# Patient Record
Sex: Female | Born: 1950
Health system: Southern US, Community
[De-identification: ages and names within clinical notes are randomized; demographics above are authoritative.]

## PROBLEM LIST (undated history)

## (undated) DIAGNOSIS — F32A Depression, unspecified: Secondary | ICD-10-CM

## (undated) DIAGNOSIS — M1990 Unspecified osteoarthritis, unspecified site: Secondary | ICD-10-CM

## (undated) DIAGNOSIS — M810 Age-related osteoporosis without current pathological fracture: Secondary | ICD-10-CM

## (undated) DIAGNOSIS — E079 Disorder of thyroid, unspecified: Secondary | ICD-10-CM

## (undated) DIAGNOSIS — I10 Essential (primary) hypertension: Secondary | ICD-10-CM

## (undated) DIAGNOSIS — H269 Unspecified cataract: Secondary | ICD-10-CM

## (undated) DIAGNOSIS — E785 Hyperlipidemia, unspecified: Secondary | ICD-10-CM

## (undated) DIAGNOSIS — K219 Gastro-esophageal reflux disease without esophagitis: Secondary | ICD-10-CM

## (undated) DIAGNOSIS — F329 Major depressive disorder, single episode, unspecified: Secondary | ICD-10-CM

## (undated) HISTORY — DX: Unspecified osteoarthritis, unspecified site: M19.90

## (undated) HISTORY — DX: Disorder of thyroid, unspecified: E07.9

## (undated) HISTORY — DX: Essential (primary) hypertension: I10

## (undated) HISTORY — DX: Hyperlipidemia, unspecified: E78.5

## (undated) HISTORY — DX: Age-related osteoporosis without current pathological fracture: M81.0

## (undated) HISTORY — DX: Depression, unspecified: F32.A

## (undated) HISTORY — PX: HERNIA REPAIR: SHX51

## (undated) HISTORY — DX: Gastro-esophageal reflux disease without esophagitis: K21.9

## (undated) HISTORY — DX: Unspecified cataract: H26.9

## (undated) HISTORY — DX: Major depressive disorder, single episode, unspecified: F32.9

---
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
## 2004-02-05 ENCOUNTER — Emergency Department (HOSPITAL_COMMUNITY): Admission: EM | Admit: 2004-02-05 | Discharge: 2004-02-05 | Payer: Self-pay | Admitting: *Deleted

## 2004-09-04 ENCOUNTER — Ambulatory Visit: Payer: Self-pay | Admitting: Internal Medicine

## 2004-09-07 ENCOUNTER — Ambulatory Visit: Payer: Self-pay | Admitting: Internal Medicine

## 2004-11-22 ENCOUNTER — Ambulatory Visit: Payer: Self-pay | Admitting: Internal Medicine

## 2004-11-30 ENCOUNTER — Ambulatory Visit: Payer: Self-pay | Admitting: Internal Medicine

## 2005-02-22 ENCOUNTER — Ambulatory Visit: Payer: Self-pay | Admitting: Internal Medicine

## 2005-03-13 ENCOUNTER — Ambulatory Visit: Payer: Self-pay | Admitting: Internal Medicine

## 2005-09-26 ENCOUNTER — Ambulatory Visit: Payer: Self-pay | Admitting: Internal Medicine

## 2005-10-11 ENCOUNTER — Ambulatory Visit: Payer: Self-pay | Admitting: Internal Medicine

## 2006-01-23 ENCOUNTER — Other Ambulatory Visit: Admission: RE | Admit: 2006-01-23 | Discharge: 2006-01-23 | Payer: Self-pay | Admitting: Internal Medicine

## 2006-01-23 ENCOUNTER — Ambulatory Visit: Payer: Self-pay | Admitting: Internal Medicine

## 2006-01-23 ENCOUNTER — Encounter: Payer: Self-pay | Admitting: Internal Medicine

## 2006-06-28 ENCOUNTER — Ambulatory Visit: Payer: Self-pay | Admitting: Internal Medicine

## 2006-07-25 ENCOUNTER — Ambulatory Visit: Payer: Self-pay | Admitting: Internal Medicine

## 2006-10-14 ENCOUNTER — Ambulatory Visit: Payer: Self-pay | Admitting: Internal Medicine

## 2006-10-14 LAB — CONVERTED CEMR LAB
Albumin: 3.8 g/dL (ref 3.5–5.2)
Alkaline Phosphatase: 96 units/L (ref 39–117)
Basophils Absolute: 0 10*3/uL (ref 0.0–0.1)
CO2: 34 meq/L — ABNORMAL HIGH (ref 19–32)
Chol/HDL Ratio, serum: 5
Creatinine, Ser: 0.8 mg/dL (ref 0.4–1.2)
Glomerular Filtration Rate, Af Am: 96 mL/min/{1.73_m2}
Glucose, Bld: 97 mg/dL (ref 70–99)
HDL: 47.1 mg/dL (ref >39.0)
Hemoglobin: 14.3 g/dL (ref 12.0–15.0)
Lymphocytes Relative: 26.9 % (ref 12.0–46.0)
MCHC: 34 g/dL (ref 30.0–36.0)
Monocytes Relative: 8.6 % (ref 3.0–11.0)
Neutro Abs: 3.2 10*3/uL (ref 1.4–7.7)
Neutrophils Relative %: 61.5 % (ref 43.0–77.0)
Platelets: 326 10*3/uL (ref 150–400)
Potassium: 3.9 meq/L (ref 3.5–5.1)
RDW: 12.5 % (ref 11.5–14.6)
TSH: 7.06 microintl units/mL — ABNORMAL HIGH (ref 0.35–5.50)
Total Bilirubin: 0.9 mg/dL (ref 0.3–1.2)
Total Protein: 7.3 g/dL (ref 6.0–8.3)
Triglyceride fasting, serum: 153 mg/dL — ABNORMAL HIGH (ref 0–149)

## 2006-10-21 ENCOUNTER — Encounter (INDEPENDENT_AMBULATORY_CARE_PROVIDER_SITE_OTHER): Payer: Self-pay | Admitting: Specialist

## 2006-10-21 ENCOUNTER — Ambulatory Visit: Payer: Self-pay | Admitting: Internal Medicine

## 2006-10-21 ENCOUNTER — Other Ambulatory Visit: Admission: RE | Admit: 2006-10-21 | Discharge: 2006-10-21 | Payer: Self-pay | Admitting: Neurosurgery

## 2006-11-04 ENCOUNTER — Ambulatory Visit: Payer: Self-pay | Admitting: Internal Medicine

## 2006-11-18 ENCOUNTER — Ambulatory Visit: Payer: Self-pay | Admitting: Gastroenterology

## 2006-12-02 ENCOUNTER — Ambulatory Visit: Payer: Self-pay | Admitting: Internal Medicine

## 2006-12-06 ENCOUNTER — Encounter: Payer: Self-pay | Admitting: Gastroenterology

## 2006-12-06 ENCOUNTER — Ambulatory Visit: Payer: Self-pay | Admitting: Gastroenterology

## 2007-02-04 ENCOUNTER — Ambulatory Visit: Payer: Self-pay | Admitting: Internal Medicine

## 2007-02-04 LAB — CONVERTED CEMR LAB: T4, Total: 6.2 ug/dL (ref 5.0–12.5)

## 2007-04-29 DIAGNOSIS — E785 Hyperlipidemia, unspecified: Secondary | ICD-10-CM

## 2007-04-29 DIAGNOSIS — I10 Essential (primary) hypertension: Secondary | ICD-10-CM

## 2007-05-13 ENCOUNTER — Ambulatory Visit: Payer: Self-pay | Admitting: Internal Medicine

## 2007-05-13 DIAGNOSIS — E039 Hypothyroidism, unspecified: Secondary | ICD-10-CM

## 2007-05-13 DIAGNOSIS — F5102 Adjustment insomnia: Secondary | ICD-10-CM | POA: Insufficient documentation

## 2007-05-13 DIAGNOSIS — F329 Major depressive disorder, single episode, unspecified: Secondary | ICD-10-CM

## 2007-05-26 DIAGNOSIS — L508 Other urticaria: Secondary | ICD-10-CM | POA: Insufficient documentation

## 2007-05-27 ENCOUNTER — Telehealth: Payer: Self-pay | Admitting: *Deleted

## 2007-05-27 ENCOUNTER — Ambulatory Visit: Payer: Self-pay | Admitting: Internal Medicine

## 2007-05-27 LAB — CONVERTED CEMR LAB
CRP, High Sensitivity: 3 (ref 0.00–5.00)
TSH: 3.86 microintl units/mL (ref 0.35–5.50)

## 2007-06-02 ENCOUNTER — Telehealth: Payer: Self-pay | Admitting: Internal Medicine

## 2007-08-11 ENCOUNTER — Ambulatory Visit: Payer: Self-pay | Admitting: Internal Medicine

## 2007-08-11 LAB — CONVERTED CEMR LAB
Cholesterol: 252 mg/dL (ref 0–200)
HDL: 46.8 mg/dL (ref >39.0)
Total CHOL/HDL Ratio: 5.4
Triglycerides: 86 mg/dL (ref 0–149)

## 2007-08-18 ENCOUNTER — Ambulatory Visit: Payer: Self-pay | Admitting: Internal Medicine

## 2007-08-18 LAB — CONVERTED CEMR LAB: HDL goal, serum: 40 mg/dL

## 2007-10-21 ENCOUNTER — Ambulatory Visit: Payer: Self-pay | Admitting: Internal Medicine

## 2007-10-21 LAB — CONVERTED CEMR LAB
Cholesterol: 228 mg/dL (ref 0–200)
HDL: 42 mg/dL (ref >39.0)
Total CHOL/HDL Ratio: 5.4
Triglycerides: 116 mg/dL (ref 0–149)
VLDL: 23 mg/dL (ref 0–40)

## 2007-10-27 ENCOUNTER — Ambulatory Visit: Payer: Self-pay | Admitting: Internal Medicine

## 2007-10-27 LAB — CONVERTED CEMR LAB
T3, Free: 2.8 pg/mL (ref 2.3–4.2)
T4, Total: 7.2 ug/dL (ref 5.0–12.5)
Thyroglobulin Ab: <30 (ref 0.0–60.0)

## 2007-12-24 ENCOUNTER — Ambulatory Visit: Payer: Self-pay | Admitting: Internal Medicine

## 2007-12-24 LAB — CONVERTED CEMR LAB: TSH: 0.04 microintl units/mL — ABNORMAL LOW (ref 0.35–5.50)

## 2008-01-08 ENCOUNTER — Ambulatory Visit: Payer: Self-pay | Admitting: Internal Medicine

## 2008-02-09 ENCOUNTER — Ambulatory Visit: Payer: Self-pay | Admitting: Internal Medicine

## 2008-05-07 ENCOUNTER — Ambulatory Visit: Payer: Self-pay | Admitting: Internal Medicine

## 2008-07-30 ENCOUNTER — Telehealth: Payer: Self-pay | Admitting: Internal Medicine

## 2008-10-15 ENCOUNTER — Ambulatory Visit: Payer: Self-pay | Admitting: Internal Medicine

## 2008-10-15 LAB — CONVERTED CEMR LAB
ALT: 28 units/L (ref 0–35)
AST: 23 units/L (ref 0–37)
CO2: 33 meq/L — ABNORMAL HIGH (ref 19–32)
Chloride: 108 meq/L (ref 96–112)
Cholesterol: 191 mg/dL (ref 0–200)
Creatinine, Ser: 0.7 mg/dL (ref 0.4–1.2)
Glucose, Bld: 92 mg/dL (ref 70–99)
HDL: 41 mg/dL (ref >39.0)
TSH: 0.04 microintl units/mL — ABNORMAL LOW (ref 0.35–5.50)
Total Bilirubin: 0.9 mg/dL (ref 0.3–1.2)
Total CHOL/HDL Ratio: 4.7
Total Protein: 7.1 g/dL (ref 6.0–8.3)
Triglycerides: 163 mg/dL — ABNORMAL HIGH (ref 0–149)

## 2008-10-22 ENCOUNTER — Ambulatory Visit: Payer: Self-pay | Admitting: Internal Medicine

## 2008-11-15 ENCOUNTER — Ambulatory Visit: Payer: Self-pay | Admitting: Internal Medicine

## 2008-11-15 ENCOUNTER — Encounter: Payer: Self-pay | Admitting: Internal Medicine

## 2009-02-22 ENCOUNTER — Ambulatory Visit: Payer: Self-pay | Admitting: Internal Medicine

## 2009-02-22 DIAGNOSIS — M810 Age-related osteoporosis without current pathological fracture: Secondary | ICD-10-CM | POA: Insufficient documentation

## 2009-03-31 ENCOUNTER — Telehealth: Payer: Self-pay | Admitting: Internal Medicine

## 2009-06-23 ENCOUNTER — Ambulatory Visit: Payer: Self-pay | Admitting: Internal Medicine

## 2009-06-23 LAB — CONVERTED CEMR LAB
CO2: 34 meq/L — ABNORMAL HIGH (ref 19–32)
Calcium: 9.9 mg/dL (ref 8.4–10.5)
Creatinine, Ser: 0.7 mg/dL (ref 0.4–1.2)
Direct LDL: 114.8 mg/dL
GFR calc non Af Amer: 91.49 mL/min (ref >60)
Glucose, Bld: 104 mg/dL — ABNORMAL HIGH (ref 70–99)
T3, Free: 3.7 pg/mL (ref 2.3–4.2)

## 2009-10-08 HISTORY — PX: UMBILICAL HERNIA REPAIR: SHX196

## 2009-10-20 ENCOUNTER — Ambulatory Visit: Payer: Self-pay | Admitting: Internal Medicine

## 2010-06-26 ENCOUNTER — Ambulatory Visit: Payer: Self-pay | Admitting: Internal Medicine

## 2010-06-26 LAB — CONVERTED CEMR LAB
Albumin: 3.7 g/dL (ref 3.5–5.2)
Alkaline Phosphatase: 71 units/L (ref 39–117)
BUN: 13 mg/dL (ref 6–23)
Basophils Absolute: 0 10*3/uL (ref 0.0–0.1)
CO2: 32 meq/L (ref 19–32)
Calcium: 9.2 mg/dL (ref 8.4–10.5)
Creatinine, Ser: 0.7 mg/dL (ref 0.4–1.2)
Eosinophils Absolute: 0.2 10*3/uL (ref 0.0–0.7)
Glucose, Bld: 96 mg/dL (ref 70–99)
Glucose, Urine, Semiquant: NEGATIVE
HDL: 47.8 mg/dL (ref >39.00)
Hemoglobin: 13.9 g/dL (ref 12.0–15.0)
Ketones, urine, test strip: NEGATIVE
Lymphocytes Relative: 24.8 % (ref 12.0–46.0)
MCHC: 34.2 g/dL (ref 30.0–36.0)
Neutro Abs: 3 10*3/uL (ref 1.4–7.7)
Neutrophils Relative %: 59.7 % (ref 43.0–77.0)
RDW: 13.9 % (ref 11.5–14.6)
Specific Gravity, Urine: 1.02
Triglycerides: 94 mg/dL (ref 0.0–149.0)
pH: 8.5

## 2010-07-03 ENCOUNTER — Other Ambulatory Visit: Admission: RE | Admit: 2010-07-03 | Discharge: 2010-07-03 | Payer: Self-pay | Admitting: Internal Medicine

## 2010-07-03 ENCOUNTER — Encounter: Payer: Self-pay | Admitting: Internal Medicine

## 2010-07-03 ENCOUNTER — Ambulatory Visit: Payer: Self-pay | Admitting: Internal Medicine

## 2010-11-07 NOTE — Assessment & Plan Note (Signed)
Summary: cpx /njr/pt rsc/cjr   Vital Signs:  Patient profile:   60 year old female Height:      67 inches Weight:      244 pounds BMI:     38.35 Temp:     98.2 degrees F oral Pulse rate:   76 / minute Resp:     14 per minute BP sitting:   136 / 80  (left arm) Cuff size:   large  Vitals Entered By: Willy Eddy, LPN (July 03, 2010 3:24 PM)  Nutrition Counseling: Patient's BMI is greater than 25 and therefore counseled on weight management options. CC: cpx, Hypertension Management Is Patient Diabetic? No   Primary Care Provider:  Stacie Glaze MD  CC:  cpx and Hypertension Management.  History of Present Illness: The pt was asked about all immunizations, health maint. services that are appropriate to their age and was given guidance on diet exercize  and weight management  weight is stll the main issue with stress eating discussion of replacement for eating  Hypertension History:      She denies headache, chest pain, palpitations, dyspnea with exertion, orthopnea, PND, peripheral edema, visual symptoms, neurologic problems, syncope, and side effects from treatment.        Positive major cardiovascular risk factors include female age 31 years old or older, hyperlipidemia, and hypertension.  Negative major cardiovascular risk factors include no history of diabetes and non-tobacco-user status.        Further assessment for target organ damage reveals no history of ASHD, stroke/TIA, or peripheral vascular disease.     Preventive Screening-Counseling & Management  Alcohol-Tobacco     Smoking Status: never     Tobacco Counseling: not indicated; no tobacco use  Problems Prior to Update: 1)  Osteoporosis, Lumbar Spine  (ICD-733.00) 2)  Urticaria Nec  (ICD-708.8) 3)  Hypothyroidism  (ICD-244.9) 4)  Depression  (ICD-311) 5)  Insomnia, Transient  (ICD-307.41) 6)  Hypertension  (ICD-401.9) 7)  Hyperlipidemia  (ICD-272.4)  Current Problems (verified): 1)   Osteoporosis, Lumbar Spine  (ICD-733.00) 2)  Urticaria Nec  (ICD-708.8) 3)  Hypothyroidism  (ICD-244.9) 4)  Depression  (ICD-311) 5)  Insomnia, Transient  (ICD-307.41) 6)  Hypertension  (ICD-401.9) 7)  Hyperlipidemia  (ICD-272.4)  Medications Prior to Update: 1)  Benicar Hct 40-25 Mg  Tabs (Olmesartan Medoxomil-Hctz) .... One By Mouth Daily 2)  Eye Vitamins   Tabs (Multiple Vitamins-Minerals) .... Take One By Mouth Daily 3)  Multivitamins   Tabs (Multiple Vitamin) .... Take One Tablet By Mouth Daily 4)  Cymbalta 30 Mg  Cpep (Duloxetine Hcl) .... Take One Po Daily 5)  Os-Cal Ultra 600 Mg  Tabs (Calcium Carb-Vit D-C-E-Mineral) .... Take One By Mouth Two Times A Day 6)  Synthroid 200 Mcg  Tabs (Levothyroxine Sodium) .... One By Mouth Daily 7)  Red Yeast Rice 600 Mg Tabs (Red Yeast Rice Extract) .... Once Daily 8)  Actonel 150 Mg Tabs (Risedronate Sodium) .... One By Mouth Mnthly 9)  Zovirax 5 % Oint (Acyclovir) .... Apply To Site 5 X A Day As Needed 10)  Betamethasone Dipropionate Aug 0.05 % Crea (Betamethasone Dipropionate Aug) .... Aplly To Site Two Times A Day  Current Medications (verified): 1)  Benicar Hct 40-25 Mg  Tabs (Olmesartan Medoxomil-Hctz) .... One By Mouth Daily 2)  Eye Vitamins   Tabs (Multiple Vitamins-Minerals) .... Take One By Mouth Daily 3)  Multivitamins   Tabs (Multiple Vitamin) .... Take One Tablet By Mouth Daily 4)  Cymbalta  30 Mg  Cpep (Duloxetine Hcl) .... Take One Po Daily 5)  Os-Cal Ultra 600 Mg  Tabs (Calcium Carb-Vit D-C-E-Mineral) .... Take One By Mouth Two Times A Day 6)  Synthroid 200 Mcg  Tabs (Levothyroxine Sodium) .... One By Mouth Daily 7)  Red Yeast Rice 600 Mg Tabs (Red Yeast Rice Extract) .... Once Daily 8)  Actonel 150 Mg Tabs (Risedronate Sodium) .... One By Mouth Mnthly 9)  Zovirax 5 % Oint (Acyclovir) .... Apply To Site 5 X A Day As Needed 10)  Betamethasone Dipropionate Aug 0.05 % Crea (Betamethasone Dipropionate Aug) .... Aplly To Site Two  Times A Day  Allergies (verified): 1)  ! Pcn 2)  ! Erythromycin  Past History:  Family History: Last updated: 04/29/2007 Family History of Arthritis Family History Other cancer Family History of Cardiovascular disorder  Social History: Last updated: 04/29/2007 Married Never Smoked Alcohol use-no Drug use-no Regular exercise-no  Risk Factors: Exercise: no (04/29/2007)  Risk Factors: Smoking Status: never (07/03/2010)  Past medical, surgical, family and social histories (including risk factors) reviewed, and no changes noted (except as noted below).  Past Medical History: Reviewed history from 05/13/2007 and no changes required. Hyperlipidemia Hypertension Depression Hypothyroidism  Past Surgical History: Reviewed history from 04/29/2007 and no changes required. Appendectomy Colonoscopy-12/06/2006  Family History: Reviewed history from 04/29/2007 and no changes required. Family History of Arthritis Family History Other cancer Family History of Cardiovascular disorder  Social History: Reviewed history from 04/29/2007 and no changes required. Married Never Smoked Alcohol use-no Drug use-no Regular exercise-no  Review of Systems  The patient denies anorexia, fever, weight loss, weight gain, vision loss, decreased hearing, hoarseness, chest pain, syncope, dyspnea on exertion, peripheral edema, prolonged cough, headaches, hemoptysis, abdominal pain, melena, hematochezia, severe indigestion/heartburn, hematuria, incontinence, genital sores, muscle weakness, suspicious skin lesions, transient blindness, difficulty walking, depression, unusual weight change, abnormal bleeding, enlarged lymph nodes, angioedema, breast masses, and testicular masses.    Physical Exam  General:  well-hydrated and overweight-appearing.   Head:  normocephalic and atraumatic.   Eyes:  pupils equal and pupils round.   Ears:  R ear normal and L ear normal.   Nose:  no external deformity  and no nasal discharge.   Mouth:  good dentition and pharynx pink and moist.   Neck:  No deformities, masses, or tenderness noted. Lungs:  normal respiratory effort and no crackles.   Heart:  normal rate and regular rhythm.   Abdomen:  Bowel sounds positive,abdomen soft and non-tender without masses, organomegaly or hernias noted. Rectal:  no external abnormalities and no masses.   Genitalia:  tag vs wart or labianormal introitus, no vaginal atrophy, and no friaility or hemorrhage.   Msk:  No deformity or scoliosis noted of thoracic or lumbar spine.   Pulses:  R and L carotid,radial,femoral,dorsalis pedis and posterior tibial pulses are full and equal bilaterally Extremities:  trace left pedal edema and trace right pedal edema.   Neurologic:  alert & oriented X3 and finger-to-nose normal.     Impression & Recommendations:  Problem # 1:  PREVENTIVE HEALTH CARE (ICD-V70.0) The pt was asked about all immunizations, health maint. services that are appropriate to their age and was given guidance on diet exercize  and weight management  Td Booster: Tdap (07/03/2010)   Flu Vax: Fluvax Non-MCR (08/18/2007)   Chol: 194 (06/26/2010)   HDL: 47.80 (06/26/2010)   LDL: 127 (06/26/2010)   TG: 94.0 (06/26/2010) TSH: 0.05 (06/26/2010)    Discussed using sunscreen, use of  alcohol, drug use, self breast exam, routine dental care, routine eye care, schedule for GYN exam, routine physical exam, seat belts, multiple vitamins, osteoporosis prevention, adequate calcium intake in diet, recommendations for immunizations, mammograms and Pap smears.  Discussed exercise and checking cholesterol.  Discussed gun safety, safe sex, and contraception.  Problem # 2:  HYPERTENSION (ICD-401.9)  Her updated medication list for this problem includes:    Benicar Hct 40-25 Mg Tabs (Olmesartan medoxomil-hctz) ..... One by mouth daily  BP today: 136/80 Prior BP: 140/90 (10/20/2009)  10 Yr Risk Heart Disease: 9 % Prior 10 Yr  Risk Heart Disease: 15 % (10/20/2009)  Labs Reviewed: K+: 4.4 (06/26/2010) Creat: : 0.7 (06/26/2010)   Chol: 194 (06/26/2010)   HDL: 47.80 (06/26/2010)   LDL: 127 (06/26/2010)   TG: 94.0 (06/26/2010)  Problem # 3:  MORBID OBESITY (ICD-278.01)  weight loss  Ht: 67 (07/03/2010)   Wt: 244 (07/03/2010)   BMI: 38.35 (07/03/2010)  Complete Medication List: 1)  Benicar Hct 40-25 Mg Tabs (Olmesartan medoxomil-hctz) .... One by mouth daily 2)  Eye Vitamins Tabs (Multiple vitamins-minerals) .... Take one by mouth daily 3)  Multivitamins Tabs (Multiple vitamin) .... Take one tablet by mouth daily 4)  Cymbalta 30 Mg Cpep (Duloxetine hcl) .... Take one po daily 5)  Os-cal Ultra 600 Mg Tabs (Calcium carb-vit d-c-e-mineral) .... Take one by mouth two times a day 6)  Synthroid 200 Mcg Tabs (Levothyroxine sodium) .... One by mouth daily 7)  Red Yeast Rice 600 Mg Tabs (Red yeast rice extract) .... Once daily 8)  Actonel 150 Mg Tabs (Risedronate sodium) .... One by mouth mnthly 9)  Zovirax 5 % Oint (Acyclovir) .... Apply to site 5 x a day as needed 10)  Betamethasone Dipropionate Aug 0.05 % Crea (Betamethasone dipropionate aug) .... Aplly to site two times a day  Other Orders: Tdap => 46yrs IM ([address]109) Admin 1st Vaccine (60454)  Hypertension Assessment/Plan:      The patient's hypertensive risk group is category B: At least one risk factor (excluding diabetes) with no target organ damage.  Her calculated 10 year risk of coronary heart disease is 9 %.  Today's blood pressure is 136/80.  Her blood pressure goal is < 140/90.  Patient Instructions: 1)  Please schedule a follow-up appointment in 6 months. 2)  It is important that you exercise regularly at least 20 minutes 5 times a week. If you develop chest pain, have severe difficulty breathing, or feel very tired , stop exercising immediately and seek medical attention. 3)  You need to lose weight. Consider a lower calorie diet and regular exercise.     Immunizations Administered:  Tetanus Vaccine:    Vaccine Type: Tdap    Site: right deltoid    Mfr: GlaxoSmithKline    Dose: 0.5 ml    Route: IM    Given by: Willy Eddy, LPN    Exp. Date: 07/27/2012    Lot #: [id number]    VIS given: 08/25/08 version given July 03, 2010.

## 2010-11-07 NOTE — Assessment & Plan Note (Signed)
Summary: 4 month rov/njr rsc bmp/njr   Vital Signs:  Patient profile:   60 year old female Height:      67 inches Weight:      242 pounds BMI:     38.04 Temp:     98.2 degrees F oral Pulse rate:   76 / minute Resp:     14 per minute BP sitting:   140 / 90  (left arm) Cuff size:   large  Vitals Entered By: Willy Eddy, LPN (October 20, 2009 9:01 AM) CC: roa, Hypertension Management, Lipid Management   CC:  roa, Hypertension Management, and Lipid Management.  History of Present Illness: review of the labs with notation that she tool the synthroid on the AM of the lab report she feels well the lipids were reviewed and diet ands exercize goals set etna mandates change to [facility]  Hypertension History:      She denies headache, chest pain, palpitations, dyspnea with exertion, orthopnea, PND, peripheral edema, visual symptoms, neurologic problems, syncope, and side effects from treatment.        Positive major cardiovascular risk factors include female age 1 years old or older, hyperlipidemia, and hypertension.  Negative major cardiovascular risk factors include no history of diabetes and non-tobacco-user status.        Further assessment for target organ damage reveals no history of ASHD, stroke/TIA, or peripheral vascular disease.    Lipid Management History:      Positive NCEP/ATP III risk factors include female age 60 years old or older, HDL cholesterol less than 40, and hypertension.  Negative NCEP/ATP III risk factors include no history of early menopause without estrogen hormone replacement, non-diabetic, non-tobacco-user status, no ASHD (atherosclerotic heart disease), no prior stroke/TIA, no peripheral vascular disease, and no history of aortic aneurysm.      Preventive Screening-Counseling & Management  Alcohol-Tobacco     Smoking Status: never  Problems Prior to Update: 1)  Osteoporosis, Lumbar Spine  (ICD-733.00) 2)  Urticaria Nec  (ICD-708.8) 3)  Hypothyroidism   (ICD-244.9) 4)  Depression  (ICD-311) 5)  Insomnia, Transient  (ICD-307.41) 6)  Hypertension  (ICD-401.9) 7)  Hyperlipidemia  (ICD-272.4)  Medications Prior to Update: 1)  Benicar Hct 40-25 Mg  Tabs (Olmesartan Medoxomil-Hctz) .... One By Mouth Daily 2)  Eye Vitamins   Tabs (Multiple Vitamins-Minerals) .... Take One By Mouth Daily 3)  Multivitamins   Tabs (Multiple Vitamin) .... Take One Tablet By Mouth Daily 4)  Cymbalta 30 Mg  Cpep (Duloxetine Hcl) .... Take One Po Daily 5)  Os-Cal Ultra 600 Mg  Tabs (Calcium Carb-Vit D-C-E-Mineral) .... Take One By Mouth Two Times A Day 6)  Synthroid 200 Mcg  Tabs (Levothyroxine Sodium) .... One By Mouth Daily 7)  Red Yeast Rice 600 Mg Tabs (Red Yeast Rice Extract) .... Once Daily 8)  Actonel 150 Mg Tabs (Risedronate Sodium) .... One By Mouth Mnthly 9)  Zovirax 5 % Oint (Acyclovir) .... Apply To Site 5 X A Day As Needed  Current Medications (verified): 1)  Benicar Hct 40-25 Mg  Tabs (Olmesartan Medoxomil-Hctz) .... One By Mouth Daily 2)  Eye Vitamins   Tabs (Multiple Vitamins-Minerals) .... Take One By Mouth Daily 3)  Multivitamins   Tabs (Multiple Vitamin) .... Take One Tablet By Mouth Daily 4)  Cymbalta 30 Mg  Cpep (Duloxetine Hcl) .... Take One Po Daily 5)  Os-Cal Ultra 600 Mg  Tabs (Calcium Carb-Vit D-C-E-Mineral) .... Take One By Mouth Two Times A Day 6)  Synthroid 200 Mcg  Tabs (Levothyroxine Sodium) .... One By Mouth Daily 7)  Red Yeast Rice 600 Mg Tabs (Red Yeast Rice Extract) .... Once Daily 8)  Actonel 150 Mg Tabs (Risedronate Sodium) .... One By Mouth Mnthly 9)  Zovirax 5 % Oint (Acyclovir) .... Apply To Site 5 X A Day As Needed  Allergies (verified): 1)  ! Pcn 2)  ! Erythromycin  Past History:  Family History: Last updated: 04/29/2007 Family History of Arthritis Family History Other cancer Family History of Cardiovascular disorder  Social History: Last updated: 04/29/2007 Married Never Smoked Alcohol use-no Drug  use-no Regular exercise-no  Risk Factors: Exercise: no (04/29/2007)  Risk Factors: Smoking Status: never (10/20/2009)  Past medical, surgical, family and social histories (including risk factors) reviewed, and no changes noted (except as noted below).  Past Medical History: Reviewed history from 05/13/2007 and no changes required. Hyperlipidemia Hypertension Depression Hypothyroidism  Past Surgical History: Reviewed history from 04/29/2007 and no changes required. Appendectomy Colonoscopy-12/06/2006  Family History: Reviewed history from 04/29/2007 and no changes required. Family History of Arthritis Family History Other cancer Family History of Cardiovascular disorder  Social History: Reviewed history from 04/29/2007 and no changes required. Married Never Smoked Alcohol use-no Drug use-no Regular exercise-no  Review of Systems  The patient denies anorexia, fever, weight loss, weight gain, vision loss, decreased hearing, hoarseness, chest pain, syncope, dyspnea on exertion, peripheral edema, prolonged cough, headaches, hemoptysis, abdominal pain, melena, hematochezia, severe indigestion/heartburn, hematuria, incontinence, genital sores, muscle weakness, suspicious skin lesions, transient blindness, difficulty walking, depression, unusual weight change, abnormal bleeding, enlarged lymph nodes, angioedema, and breast masses.    Physical Exam  General:  well-hydrated and overweight-appearing.   Head:  normocephalic and atraumatic.   Eyes:  pupils equal and pupils round.   Nose:  no external deformity and no nasal discharge.   Mouth:  good dentition and pharynx pink and moist.   Neck:  No deformities, masses, or tenderness noted. Lungs:  normal respiratory effort and no crackles.   Heart:  normal rate and regular rhythm.   Abdomen:  Bowel sounds positive,abdomen soft and non-tender without masses, organomegaly or hernias noted. Msk:  normal ROM, no joint tenderness, and  no joint swelling.   Extremities:  No clubbing, cyanosis, edema, or deformity noted with normal full range of motion of all joints.   Neurologic:  No cranial nerve deficits noted. Station and gait are normal. Plantar reflexes are down-going bilaterally. DTRs are symmetrical throughout. Sensory, motor and coordinative functions appear intact.   Impression & Recommendations:  Problem # 1:  HYPOTHYROIDISM (ICD-244.9)  Her updated medication list for this problem includes:    Synthroid 200 Mcg Tabs (Levothyroxine sodium) ..... One by mouth daily  Labs Reviewed: TSH: 0.03 (06/23/2009)   Free T4: 1.9 (06/23/2009)    Chol: 179 (06/23/2009)   HDL: 38.10 (06/23/2009)   LDL: 117 (10/15/2008)   TG: 163 (10/15/2008)  Problem # 2:  HYPERTENSION (ICD-401.9) weigth gain Her updated medication list for this problem includes:    Benicar Hct 40-25 Mg Tabs (Olmesartan medoxomil-hctz) ..... One by mouth daily  BP today: 140/90 Prior BP: 120/80 (06/23/2009)  10 Yr Risk Heart Disease: 15 % Prior 10 Yr Risk Heart Disease: 11 % (02/22/2009)  Labs Reviewed: K+: 3.9 (06/23/2009) Creat: : 0.7 (06/23/2009)   Chol: 179 (06/23/2009)   HDL: 38.10 (06/23/2009)   LDL: 117 (10/15/2008)   TG: 163 (10/15/2008)  Problem # 3:  HYPERLIPIDEMIA (ICD-272.4) using beyond red rick yeast Labs Reviewed: SGOT:  23 (10/15/2008)   SGPT: 28 (10/15/2008)  Lipid Goals: Chol Goal: 200 (08/18/2007)   HDL Goal: 40 (08/18/2007)   LDL Goal: 130 (08/18/2007)   TG Goal: 150 (08/18/2007)  10 Yr Risk Heart Disease: 15 % Prior 10 Yr Risk Heart Disease: 11 % (02/22/2009)   HDL:38.10 (06/23/2009), 41.0 (10/15/2008)  LDL:117 (10/15/2008), DEL (10/21/2007)  Chol:179 (06/23/2009), 191 (10/15/2008)  Trig:163 (10/15/2008), 116 (10/21/2007)  Complete Medication List: 1)  Benicar Hct 40-25 Mg Tabs (Olmesartan medoxomil-hctz) .... One by mouth daily 2)  Eye Vitamins Tabs (Multiple vitamins-minerals) .... Take one by mouth daily 3)   Multivitamins Tabs (Multiple vitamin) .... Take one tablet by mouth daily 4)  Cymbalta 30 Mg Cpep (Duloxetine hcl) .... Take one po daily 5)  Os-cal Ultra 600 Mg Tabs (Calcium carb-vit d-c-e-mineral) .... Take one by mouth two times a day 6)  Synthroid 200 Mcg Tabs (Levothyroxine sodium) .... One by mouth daily 7)  Red Yeast Rice 600 Mg Tabs (Red yeast rice extract) .... Once daily 8)  Actonel 150 Mg Tabs (Risedronate sodium) .... One by mouth mnthly 9)  Zovirax 5 % Oint (Acyclovir) .... Apply to site 5 x a day as needed 10)  Betamethasone Dipropionate Aug 0.05 % Crea (Betamethasone dipropionate aug) .... Aplly to site two times a day  Hypertension Assessment/Plan:      The patient's hypertensive risk group is category B: At least one risk factor (excluding diabetes) with no target organ damage.  Her calculated 10 year risk of coronary heart disease is 15 %.  Today's blood pressure is 140/90.  Her blood pressure goal is < 140/90.  Lipid Assessment/Plan:      Based on NCEP/ATP III, the patient's risk factor category is "2 or more risk factors and a calculated 10 year CAD risk of < 20%".  The patient's lipid goals are as follows: Total cholesterol goal is 200; LDL cholesterol goal is 130; HDL cholesterol goal is 40; Triglyceride goal is 150.  Her LDL cholesterol goal has been met.  Secondary causes for hyperlipidemia have been ruled out.  She has been counseled on adjunctive measures for lowering her cholesterol and has been provided with dietary instructions.    Patient Instructions: 1)  Please schedule a follow-up appointment in 6 months. Prescriptions: BETAMETHASONE DIPROPIONATE AUG 0.05 % CREA (BETAMETHASONE DIPROPIONATE AUG) aplly to site two times a day  #30gm x 1   Entered and Authorized by:   Stacie Glaze MD   Signed by:   Stacie Glaze MD on 10/20/2009   Method used:   Electronically to        [facility] (retail)       [address]       [place], [place]  [address]       Ph: [phone]       Fax: [phone]   RxID:   [id number] ACTONEL 150 MG TABS (RISEDRONATE SODIUM) one by mouth mnthly  #5 x 3   Entered and Authorized by:   Stacie Glaze MD   Signed by:   Stacie Glaze MD on 10/20/2009   Method used:   Electronically to        [facility]* (mail-order)       [address] [place], [place]  [address]       Ph: [phone]       Fax: [phone]  RxID:   [id number] CYMBALTA 30 MG  CPEP (DULOXETINE HCL) Take one po daily  #90 x 3   Entered and Authorized by:   Stacie Glaze MD   Signed by:   Stacie Glaze MD on 10/20/2009   Method used:   Electronically to        [facility]* (mail-order)       [address] [place], [place]  [address]       Ph: [phone]       Fax: [phone]   RxID:   [id number] BENICAR HCT 40-25 MG  TABS (OLMESARTAN MEDOXOMIL-HCTZ) one by mouth daily  #90 x 3   Entered and Authorized by:   Stacie Glaze MD   Signed by:   Stacie Glaze MD on 10/20/2009   Method used:   Electronically to        [facility]* (mail-order)       [address] [place], [place]  [address]       Ph: [phone]       Fax: [phone]   RxID:   [id number]

## 2010-12-21 ENCOUNTER — Encounter: Payer: Self-pay | Admitting: Internal Medicine

## 2010-12-25 ENCOUNTER — Ambulatory Visit (INDEPENDENT_AMBULATORY_CARE_PROVIDER_SITE_OTHER): Payer: Managed Care, Other (non HMO) | Admitting: Internal Medicine

## 2010-12-25 ENCOUNTER — Encounter: Payer: Self-pay | Admitting: Internal Medicine

## 2010-12-25 ENCOUNTER — Ambulatory Visit: Payer: Self-pay | Admitting: Internal Medicine

## 2010-12-25 VITALS — BP 120/82 | HR 72 | Temp 98.2°F | Resp 14 | Ht 67.0 in | Wt 250.0 lb

## 2010-12-25 DIAGNOSIS — I10 Essential (primary) hypertension: Secondary | ICD-10-CM

## 2010-12-25 DIAGNOSIS — E785 Hyperlipidemia, unspecified: Secondary | ICD-10-CM

## 2010-12-25 DIAGNOSIS — E039 Hypothyroidism, unspecified: Secondary | ICD-10-CM

## 2010-12-25 NOTE — Assessment & Plan Note (Signed)
At goal but weight gain is an issue that needs to be addressed

## 2010-12-25 NOTE — Assessment & Plan Note (Signed)
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 We will monitor in 3 months time to see if she's been successful in turning around weight gain and beginning an exercise program which are very important for her continued health

## 2010-12-25 NOTE — Assessment & Plan Note (Signed)
Stable t3 and t4

## 2010-12-25 NOTE — Progress Notes (Signed)
Subjective:    Patient ID: [name], female    DOB: 06-Nov-1950, 60 y.o.   MRN: [id number]  HPI   patient is a 60 year old white female who presents for followup of hyperlipidemia hypertension hypothyroidism and morbid obesity.  His weight checked today to see if she was able to follow a diet plan and lose weight she has not exercised she has not followed the diet plan and she has actually gained weight since her last visit.  We discussed the impact on her other comorbid problems of weight gain her blood pressure remained stable we'll not she continues to gain weight her lipids were at goal last visit but they will not be if she continues to gain weight plus osteoarthritic problems and will markedly increase if she does not lose weight.    Review of Systems  Constitutional: Negative for activity change, appetite change and fatigue.  HENT: Negative for ear pain, congestion, neck pain, postnasal drip and sinus pressure.   Eyes: Negative for redness and visual disturbance.  Respiratory: Negative for cough, shortness of breath and wheezing.   Gastrointestinal: Negative for abdominal pain and abdominal distention.  Genitourinary: Negative for dysuria, frequency and menstrual problem.  Musculoskeletal: Negative for myalgias, joint swelling and arthralgias.  Skin: Negative for rash and wound.  Neurological: Negative for dizziness, weakness and headaches.  Hematological: Negative for adenopathy. Does not bruise/bleed easily.  Psychiatric/Behavioral: Negative for sleep disturbance and decreased concentration.   Past Medical History  Diagnosis Date  . Hyperlipidemia   . Hypertension   . Depression   . Thyroid disease    Past Surgical History  Procedure Date  . Appendectomy   . Colonoscopy     reports that she has never smoked. She does not have any smokeless tobacco history on file. She reports that she does not drink alcohol or use illicit drugs. family history includes Cancer in  her father; Heart disease in her mother; and Hyperlipidemia in her mother. Allergies  Allergen Reactions  . Erythromycin     REACTION: Rash  . Penicillins     REACTION: Hives       Objective:   Physical Exam  Nursing note and vitals reviewed. Constitutional: She is oriented to person, place, and time. She appears well-developed and well-nourished. No distress.        Morbidly obese  HENT:  Head: Normocephalic and atraumatic.  Right Ear: External ear normal.  Left Ear: External ear normal.  Nose: Nose normal.  Mouth/Throat: Oropharynx is clear and moist.  Eyes: Conjunctivae and EOM are normal. Pupils are equal, round, and reactive to light.  Neck: Normal range of motion. Neck supple. No JVD present. No tracheal deviation present. No thyromegaly present.  Cardiovascular: Normal rate, regular rhythm, normal heart sounds and intact distal pulses.   No murmur heard. Pulmonary/Chest: Effort normal and breath sounds normal. She has no wheezes. She exhibits no tenderness.  Abdominal: Soft. Bowel sounds are normal.  Musculoskeletal: Normal range of motion. She exhibits no edema and no tenderness.  Lymphadenopathy:    She has no cervical adenopathy.  Neurological: She is alert and oriented to person, place, and time. She has normal reflexes. No cranial nerve deficit.  Skin: Skin is warm and dry. She is not diaphoretic.  Psychiatric: She has a normal mood and affect. Her behavior is normal.          Assessment & Plan:   we spent over 30 minutes face-to-face counseling the patient about diet exercise and weight  loss of their impact on her other problems she is currently stable with hypertension hyperlipidemia hypothyroidism but these would not remain stable she continues on his  Weight gain followup visit  at 4 months

## 2010-12-25 NOTE — Assessment & Plan Note (Signed)
Blood pressure is stable 

## 2011-01-30 ENCOUNTER — Other Ambulatory Visit: Payer: Self-pay | Admitting: *Deleted

## 2011-01-30 MED ORDER — CEPHALEXIN 500 MG PO CAPS: ORAL_CAPSULE | ORAL | Status: AC

## 2011-01-30 NOTE — Telephone Encounter (Signed)
Dr. Lovell Sheehan notified re: Penicillin allergy and Keflex is fine.

## 2011-01-30 NOTE — Telephone Encounter (Signed)
Per dr Lovell Sheehan- may have keflex 500 tid for 7 days and she needs to suck on lemon to stimulate the salivary gland- if not better will need to go to ent

## 2011-01-30 NOTE — Telephone Encounter (Signed)
Chart states pt has penicillin allergy.

## 2011-01-30 NOTE — Telephone Encounter (Signed)
Pt has a ? Blocked salivary gland on one side of face.  Looks like mumps, pt states.  No pain.  Looking for advice.

## 2011-02-25 ENCOUNTER — Inpatient Hospital Stay (HOSPITAL_COMMUNITY)
Admission: EM | Admit: 2011-02-25 | Discharge: 2011-02-27 | DRG: 355 | Disposition: A | Discharge status: Home / Self Care | Payer: Managed Care, Other (non HMO) | Attending: General Surgery | Admitting: General Surgery

## 2011-02-25 DIAGNOSIS — Z888 Allergy status to other drugs, medicaments and biological substances status: Secondary | ICD-10-CM

## 2011-02-25 DIAGNOSIS — I10 Essential (primary) hypertension: Secondary | ICD-10-CM | POA: Diagnosis present

## 2011-02-25 DIAGNOSIS — Z880 Allergy status to penicillin: Secondary | ICD-10-CM

## 2011-02-25 DIAGNOSIS — K420 Umbilical hernia with obstruction, without gangrene: Principal | ICD-10-CM | POA: Diagnosis present

## 2011-02-25 DIAGNOSIS — F3289 Other specified depressive episodes: Secondary | ICD-10-CM | POA: Diagnosis present

## 2011-02-25 DIAGNOSIS — E039 Hypothyroidism, unspecified: Secondary | ICD-10-CM | POA: Diagnosis present

## 2011-02-25 DIAGNOSIS — F329 Major depressive disorder, single episode, unspecified: Secondary | ICD-10-CM | POA: Diagnosis present

## 2011-02-25 LAB — BASIC METABOLIC PANEL
CO2: 34 mEq/L — ABNORMAL HIGH (ref 19–32)
Glucose, Bld: 108 mg/dL — ABNORMAL HIGH (ref 70–99)
Potassium: 3.6 mEq/L (ref 3.5–5.1)
Sodium: 144 mEq/L (ref 135–145)

## 2011-02-25 LAB — DIFFERENTIAL
Basophils Absolute: 0 10*3/uL (ref 0.0–0.1)
Lymphocytes Relative: 21 % (ref 12–46)
Lymphs Abs: 1.3 10*3/uL (ref 0.7–4.0)
Monocytes Absolute: 0.7 10*3/uL (ref 0.1–1.0)
Neutro Abs: 4.3 10*3/uL (ref 1.7–7.7)

## 2011-02-25 LAB — CBC
HCT: 42.3 % (ref 36.0–46.0)
Hemoglobin: 14.4 g/dL (ref 12.0–15.0)
WBC: 6.5 10*3/uL (ref 4.0–10.5)

## 2011-02-26 NOTE — H&P (Signed)
  NAMEANGELINA, [name]                ACCOUNT NO.:  [id number]  MEDICAL RECORD NO.:  [id number]           PATIENT TYPE:  I  LOCATION:  5505                         FACILITY:  [facility]  PHYSICIAN:  Ollen Gross. Vernell Morgans, M.D. DATE OF BIRTH:  1951/08/26  DATE OF ADMISSION:  02/25/2011 DATE OF DISCHARGE:                             HISTORY & PHYSICAL   HISTORY OF PRESENT ILLNESS:  [name] is a 60 year old white female who knows she has had an umbilical hernia for a long time.  On Saturday morning, she noticed that it got bigger and harder and would not go down even when lying down.  She has had some discomfort associated with it. She has not had any nausea or vomiting.  She has no fevers or chills. Her bowels are moving regularly.  PAST MEDICAL HISTORY:  Significant for: 1. Hypothyroidism. 2. Hypertension. 3. Depression. 4. Umbilical hernia.  PAST SURGICAL HISTORY:  Significant for appendectomy.  MEDICATIONS:  Benicar, Cymbalta, and Synthroid.  ALLERGIES:  PENICILLIN and ERYTHROMYCIN which causes rash.  SOCIAL HISTORY:  She denies use of tobacco or tobacco products.  FAMILY HISTORY:  Noncontributory.  PHYSICAL EXAMINATION:  VITAL SIGNS:  Her temperature is 97.9, pulse 76, blood pressure 128/66. GENERAL:  She is an obese white female in no acute distress. SKIN:  Warm and dry.  No jaundice. EYES:  Her extraocular movements are intact.  Pupils equal, round and reactive to light.  Sclerae nonicteric. LUNGS:  Clear bilaterally with no use of accessory respiratory muscles. HEART:  Regular rate and rhythm with impulse in left chest. ABDOMEN:  Soft.  She does have fairly sizable umbilical hernia that is incarcerated and not reducible, it is mildly tender to manipulation, she is not distended.  No signs of obstruction. EXTREMITIES:  No cyanosis, clubbing or edema.  Good strength in her arms and legs. PSYCHOLOGIC:  She is alert and oriented x3 with no sudden anxiety or depression.  Her  lab work is still pending.  ASSESSMENT/PLAN:  This is a 60 year old white female who presents with an incarcerated umbilical hernia.  I suspect since she has no sign of obstruction but this is mostly incarcerated omental fat.  We will plan to admit her, check some preoperative labs on her, start her on some IV hydration and plan for possible surgery to fix the hernia little later today. Discussed with her in detail the risks and benefits of operation as well as some of the technical aspects and she understands and wishes to proceed.     Ollen Gross. Vernell Morgans, M.D.     PST/MEDQ  D:  02/25/2011  T:  02/25/2011  Job:  [id number]  Electronically Signed by Chevis Pretty III M.D. on 02/26/2011 01:57:55 PM

## 2011-03-06 NOTE — Discharge Summary (Signed)
[name], [name]                ACCOUNT NO.:  [id number]  MEDICAL RECORD NO.:  [id number]           PATIENT TYPE:  I  LOCATION:  5505                         FACILITY:  [facility]  PHYSICIAN:  [name]. [name], M.D.DATE OF BIRTH:  1951-03-12  DATE OF ADMISSION:  02/25/2011 DATE OF DISCHARGE:  02/27/2011                              DISCHARGE SUMMARY   ADMISSION DIAGNOSES: 1. Incarcerated umbilical hernia. 2. Hypothyroid. 3. Hypertension. 4. Depression. 5. BMI of 40.  DISCHARGE DIAGNOSIS: 1. Incarcerated umbilical hernia. 2. Hypothyroid. 3. Hypertension. 4. Depression. 5. BMI of 40.  PROCEDURES:  Repair of incarcerated umbilical hernia Feb 25, 2011, Dr. Jimmye Taylor.  BRIEF HISTORY:  The patient is a 60 year old female with an umbilical hernia for some time.  On Saturday morning, she noticed that it got bigger and harder. It would not become softer even when she was lying down. she had ongoing discomfort and presented to the emergency room at [facility].  She was referred to General Surgery and seen in consultation by Dr. Ollen Taylor. [name].  He agreed she had a an incarcerated umbilical hernia.  There was no sign of obstruction.  He recommended she be admitted for, IV hydration with plans to fix her hernia later in the day. The patient was admitted, placed on the floor.  She remained hemodynamically stable.  PMH:1. hypertension,     2. hypothyroidism,     3. depression,     4. umbilical hernia.  PAST SURGERIES:  None.  MEDICATIONS ON ADMISSION:  Benicar, Cymbalta, and Synthroid.  ALLERGIES:  PENICILLIN and ERYTHROMYCIN which causes rash.  For further history and physical, please see the dictated chart.  HOSPITAL COURSE:  The patient was admitted.  She was seen by Dr. Lindie Taylor and subsequently scheduled for the OR later in the day.  She underwent umbilical hernia repair.  She tolerated the procedure well and returned to the floor.  First postoperative morning she  was still having fair amount of discomfort requiring morphine with no real resolution of pain with Percocet.  She was subsequently changed later to Vicodin.  She has been up to a full diet and has tolerated that well.  We plan to advance her to a soft diet and if she tolerates this, we will discharge her home after lunch.  She is to clean her wounds with plain soap and water.  She has a Telfa dressing over the umbilical site.  She can remove that tomorrow.  Steri-Strips to come off in 7-10 days with a showering.  She will follow up with Dr. Lindie Taylor in 2 weeks.  DISCHARGE MEDICATIONS:  She will continue her home meds which include: 1. Benicar HCT 40/25, 1 daily. 2. Beyond red yeast 1 b.i.d. 3. Cymbalta 30 mg daily. 4. Multivitamin 1 daily. 5. Os-Cal 1 tablet b.i.d. 6. Synthroid 200 mcg daily.  She is instructed to contact Dr. Darryll Taylor, her primary care for followup.  She is instructed to call if she has any problems with her incision.  We recommended she use ibuprofen or Tylenol for moderate pain and she will be given a prescription for  Vicodin 5/325, 1-2 tablets q.4 p.r.n. for more severe pain.  CONDITION ON DISCHARGE:  Improved.     [name], P.A.   ______________________________ [name]. [name], M.D.    [name]/MEDQ  D:  02/27/2011  T:  02/27/2011  Job:  [id number]  cc:   Martha Glaze, MD  Electronically Signed by [name] P.A. on 03/01/2011 10:42:35 AM Electronically Signed by [name] M.D. on 03/06/2011 01:16:20 PM

## 2011-03-07 NOTE — Op Note (Signed)
[name], [name]                ACCOUNT NO.:  [id number]  MEDICAL RECORD NO.:  [id number]           PATIENT TYPE:  I  LOCATION:  5505                         FACILITY:  [facility]  PHYSICIAN:  Cherylynn Ridges, M.D.    DATE OF BIRTH:  Feb 04, 1951  DATE OF PROCEDURE:  02/25/2011 DATE OF DISCHARGE:                              OPERATIVE REPORT   PREOPERATIVE DIAGNOSIS:  Incarcerated umbilical hernia.  POSTOPERATIVE DIAGNOSIS:  Incarcerated umbilical hernia.  PROCEDURE:  Repair of incarcerated umbilical hernia.  SURGEON:  Cherylynn Ridges, MD  ANESTHESIA:  General endotracheal.  ESTIMATED BLOOD LOSS:  Less than 30 mL.  COMPLICATIONS:  None.  CONDITION:  Stable.  FINDINGS:  Wide omentum incarcerated and 4-cm umbilical hernia defect.  INDICATIONS FOR OPERATION:  The patient is a 60 year old female with known umbilical hernia but recently incarcerated who comes in now for repair.  OPERATION:  The patient was taken to the operating room and placed on table in supine position.  After an adequate general endotracheal anesthetic was administered, she was prepped and draped in usual sterile manner exposing the midline of the abdomen.  After proper time-out was performed identifying the patient and the procedure be done the area of the incision was marked with a marking pen.  We made a transverse curvilinear incision approximately 8 cm long using a #10 blade.  It was taken down to the subcutaneous tissue where we immediately ran into the large incarcerated hernia.  The hernia sac was intact.  We were able to dissect circumferentially around it down to its fascial edges.  At that point, we incised at the fascial edges using electrocautery into the hernia sac releasing large amount of omentum. In order to reduce the omentum back into the peritoneal cavity, we had to open the incision about a total of a centimeter on each side allowing the omentum to fall back in.  Once that was done it was  completely reduced we were able to repair the hernia defect using a circular piece of Proceed mesh with the flanges which we attached in an undulating manner using interrupted old Novafil sutures.  A total of 8 were used and evenly spaced circumferentially around the defect.  Once this was done, it was attached to the flanges using old Proceed and then we closed the fascia on top using old Novafil sutures.  Once we had completely repaired the defect, we irrigated with antibiotic solution which the mesh had been soaked prior to being implanted.  We then closed with 3-0 Vicryl in a deep layer.  We invaginated the umbilicus so that it was concave again and attached it in the base of the umbilicus down to the fascia.  A couple of sutures were used to do that and we reinforced it with an external plug of 2 x 2s in the umbilicus under the dressing.  We closed the subcuticular skin using running 3-0 Monocryl.  No local anesthetic was used.  Dermabond, Steri-Strips, rolled up 2 x 2 and Tegaderm were used to complete the dressings.  All counts were correct including needles, sponges and instruments.  Cherylynn Ridges, M.D.     JOW/MEDQ  D:  02/25/2011  T:  02/26/2011  Job:  [id number]  Electronically Signed by Jimmye Norman M.D. on 03/07/2011 05:12:17 PM

## 2011-04-12 ENCOUNTER — Other Ambulatory Visit: Payer: Self-pay | Admitting: *Deleted

## 2011-04-12 MED ORDER — LEVOTHYROXINE SODIUM 200 MCG PO TABS: 200.0000 ug | ORAL_TABLET | Freq: Every day | ORAL | Status: DC

## 2011-04-26 ENCOUNTER — Ambulatory Visit (INDEPENDENT_AMBULATORY_CARE_PROVIDER_SITE_OTHER): Payer: Managed Care, Other (non HMO) | Admitting: Internal Medicine

## 2011-04-26 ENCOUNTER — Encounter: Payer: Self-pay | Admitting: Internal Medicine

## 2011-04-26 VITALS — BP 124/80 | HR 76 | Temp 98.2°F | Resp 16 | Ht 66.0 in | Wt 246.0 lb

## 2011-04-26 DIAGNOSIS — I10 Essential (primary) hypertension: Secondary | ICD-10-CM

## 2011-04-26 DIAGNOSIS — F329 Major depressive disorder, single episode, unspecified: Secondary | ICD-10-CM

## 2011-04-26 DIAGNOSIS — E785 Hyperlipidemia, unspecified: Secondary | ICD-10-CM

## 2011-04-26 DIAGNOSIS — E039 Hypothyroidism, unspecified: Secondary | ICD-10-CM

## 2011-04-26 LAB — T3, FREE: T3, Free: 2.8 pg/mL (ref 2.3–4.2)

## 2011-04-26 LAB — T4, FREE: Free T4: 1.22 ng/dL (ref 0.60–1.60)

## 2011-04-26 NOTE — Assessment & Plan Note (Signed)
The pt has lost weight

## 2011-04-26 NOTE — Assessment & Plan Note (Signed)
stable °

## 2011-04-26 NOTE — Progress Notes (Signed)
  Subjective:    Patient ID: [name], female    DOB: 1951/03/18, 60 y.o.   MRN: [id number]  HPI    Review of Systems  Constitutional: Negative for activity change, appetite change and fatigue.  HENT: Negative for ear pain, congestion, neck pain, postnasal drip and sinus pressure.   Eyes: Negative for redness and visual disturbance.  Respiratory: Negative for cough, shortness of breath and wheezing.   Gastrointestinal: Negative for abdominal pain and abdominal distention.  Genitourinary: Negative for dysuria, frequency and menstrual problem.  Musculoskeletal: Negative for myalgias, joint swelling and arthralgias.  Skin: Negative for rash and wound.  Neurological: Negative for dizziness, weakness and headaches.  Hematological: Negative for adenopathy. Does not bruise/bleed easily.  Psychiatric/Behavioral: Negative for sleep disturbance and decreased concentration.   Past Medical History  Diagnosis Date  . Hyperlipidemia   . Hypertension   . Depression   . Thyroid disease    Past Surgical History  Procedure Date  . Appendectomy   . Colonoscopy     reports that she has never smoked. She does not have any smokeless tobacco history on file. She reports that she does not drink alcohol or use illicit drugs. family history includes Cancer in her father; Heart disease in her mother; and Hyperlipidemia in her mother. Allergies  Allergen Reactions  . Erythromycin     REACTION: Rash  . Penicillins     REACTION: Hives       Objective:   Physical Exam  Nursing note and vitals reviewed. Constitutional: She is oriented to person, place, and time. She appears well-developed and well-nourished. No distress.  HENT:  Head: Normocephalic and atraumatic.  Right Ear: External ear normal.  Left Ear: External ear normal.  Nose: Nose normal.  Mouth/Throat: Oropharynx is clear and moist.  Eyes: Conjunctivae and EOM are normal. Pupils are equal, round, and reactive to light.  Neck:  Normal range of motion. Neck supple. No JVD present. No tracheal deviation present. No thyromegaly present.  Cardiovascular: Normal rate, regular rhythm, normal heart sounds and intact distal pulses.   No murmur heard. Pulmonary/Chest: Effort normal and breath sounds normal. She has no wheezes. She exhibits no tenderness.  Abdominal: Soft. Bowel sounds are normal.  Musculoskeletal: Normal range of motion. She exhibits no edema and no tenderness.  Lymphadenopathy:    She has no cervical adenopathy.  Neurological: She is alert and oriented to person, place, and time. She has normal reflexes. No cranial nerve deficit.  Skin: Skin is warm and dry. She is not diaphoretic.  Psychiatric: She has a normal mood and affect. Her behavior is normal.          Assessment & Plan:

## 2011-04-26 NOTE — Assessment & Plan Note (Signed)
The pt had hernia surgery with stable creatinine and BUN

## 2011-04-26 NOTE — Assessment & Plan Note (Signed)
The pt has weight gain and some symptoms of hypothyroidism

## 2011-07-30 ENCOUNTER — Other Ambulatory Visit: Payer: Self-pay | Admitting: Internal Medicine

## 2011-08-02 ENCOUNTER — Ambulatory Visit: Payer: Managed Care, Other (non HMO) | Admitting: Internal Medicine

## 2011-08-17 ENCOUNTER — Encounter: Payer: Self-pay | Admitting: Internal Medicine

## 2011-08-17 ENCOUNTER — Ambulatory Visit (INDEPENDENT_AMBULATORY_CARE_PROVIDER_SITE_OTHER): Payer: Managed Care, Other (non HMO) | Admitting: Internal Medicine

## 2011-08-17 VITALS — BP 132/80 | HR 80 | Temp 98.2°F | Resp 16 | Ht 65.0 in | Wt 238.0 lb

## 2011-08-17 DIAGNOSIS — E785 Hyperlipidemia, unspecified: Secondary | ICD-10-CM

## 2011-08-17 DIAGNOSIS — F329 Major depressive disorder, single episode, unspecified: Secondary | ICD-10-CM

## 2011-08-17 DIAGNOSIS — I10 Essential (primary) hypertension: Secondary | ICD-10-CM

## 2011-08-17 DIAGNOSIS — E039 Hypothyroidism, unspecified: Secondary | ICD-10-CM

## 2011-08-17 NOTE — Patient Instructions (Signed)
The patient is instructed to continue all medications as prescribed. Schedule followup with check out clerk upon leaving the clinic  

## 2011-11-28 ENCOUNTER — Encounter: Payer: Self-pay | Admitting: Gastroenterology

## 2012-01-08 NOTE — Progress Notes (Signed)
  Subjective:    Patient ID: [name], female    DOB: [birth year], 61 y.o.   MRN: [id number]  HPI patient presents to discuss the continued use of Cymbalta as an antidepressant.  She's been going on for long term care for her mother who recently died of end-stage emphysema and we discussed the continuation or cessation of Cymbalta.  She is also treated for hypertension morbid obesity hypothyroidism.      Review of Systems  Constitutional: Negative for activity change, appetite change and fatigue.  HENT: Negative for ear pain, congestion, neck pain, postnasal drip and sinus pressure.   Eyes: Negative for redness and visual disturbance.  Respiratory: Negative for cough, shortness of breath and wheezing.   Gastrointestinal: Negative for abdominal pain and abdominal distention.  Genitourinary: Negative for dysuria, frequency and menstrual problem.  Musculoskeletal: Negative for myalgias, joint swelling and arthralgias.  Skin: Negative for rash and wound.  Neurological: Negative for dizziness, weakness and headaches.  Hematological: Negative for adenopathy. Does not bruise/bleed easily.  Psychiatric/Behavioral: Negative for sleep disturbance and decreased concentration.       Objective:   Physical Exam  Nursing note and vitals reviewed. Constitutional: She is oriented to person, place, and time. She appears well-developed and well-nourished. No distress.  HENT:  Head: Normocephalic and atraumatic.  Right Ear: External ear normal.  Left Ear: External ear normal.  Nose: Nose normal.  Mouth/Throat: Oropharynx is clear and moist.  Eyes: Conjunctivae and EOM are normal. Pupils are equal, round, and reactive to light.  Neck: Normal range of motion. Neck supple. No JVD present. No tracheal deviation present. No thyromegaly present.  Cardiovascular: Normal rate, regular rhythm, normal heart sounds and intact distal pulses.   No murmur heard. Pulmonary/Chest: Effort normal and breath  sounds normal. She has no wheezes. She exhibits no tenderness.  Abdominal: Soft. Bowel sounds are normal.  Musculoskeletal: Normal range of motion. She exhibits no edema and no tenderness.  Lymphadenopathy:    She has no cervical adenopathy.  Neurological: She is alert and oriented to person, place, and time. She has normal reflexes. No cranial nerve deficit.  Skin: Skin is warm and dry. She is not diaphoretic.  Psychiatric: She has a normal mood and affect. Her behavior is normal.          Assessment & Plan:  He has very strong religious beliefs and does not feel like she wants to continue the Cymbalta at this time.  We discussed a contract with the patient and her husband if she should develop symptoms of depression that she would be willing to contact our office and resume the Cymbalta

## 2012-02-14 ENCOUNTER — Ambulatory Visit: Payer: Managed Care, Other (non HMO) | Admitting: Internal Medicine

## 2012-05-07 ENCOUNTER — Other Ambulatory Visit: Payer: Self-pay | Admitting: Internal Medicine

## 2012-07-07 ENCOUNTER — Telehealth: Payer: Self-pay | Admitting: Internal Medicine

## 2012-07-07 NOTE — Telephone Encounter (Signed)
Caller: Sue/Patient; Patient Name: [name]; PCP: Darryll Capers (Adults only); Best Callback Phone Number: [phone]  07-07-12 she states she injured her knee in 2011, she states for past few weeks unsure exact onset is having more knee pain, it is becoming more painful climbing stairs and  will sometimes wake her at night.  No reinjury   All emergent symptoms per Knee Non Injury ruled out except for new onset mild to moderate pain that has not improved with 24 hours of home care   Home care advice given and appt made for 07-08-12 with Dr Lovell Sheehan.

## 2012-07-08 ENCOUNTER — Encounter: Payer: Self-pay | Admitting: Internal Medicine

## 2012-07-08 ENCOUNTER — Ambulatory Visit (INDEPENDENT_AMBULATORY_CARE_PROVIDER_SITE_OTHER): Payer: Managed Care, Other (non HMO) | Admitting: Internal Medicine

## 2012-07-08 VITALS — BP 130/80 | HR 76 | Temp 98.5°F | Resp 16 | Ht 65.0 in | Wt 232.0 lb

## 2012-07-08 DIAGNOSIS — Z23 Encounter for immunization: Secondary | ICD-10-CM

## 2012-07-08 DIAGNOSIS — M1710 Unilateral primary osteoarthritis, unspecified knee: Secondary | ICD-10-CM

## 2012-07-08 MED ORDER — METHYLPREDNISOLONE ACETATE 40 MG/ML IJ SUSP: 40.0000 mg | Freq: Once | INTRAMUSCULAR | Status: DC

## 2012-07-08 NOTE — Patient Instructions (Signed)
You have received a steroid injection into a joint space. It will take up to 48 hours before you notice a difference in the pain in the joint. For the next few hours keep ice on the site of the injection. Do not exert the injected joint for the next 24 hours.  

## 2012-07-08 NOTE — Progress Notes (Signed)
  Subjective:    Patient ID: [name], female    DOB: 1951-03-20, 61 y.o.   MRN: [id number]  HPI  Patient has a history of distant trauma to the knee several years ago she denies any, recently but has recently noted increased pain in her knee and decreased mobility.  The pain occurs with weightbearing and she notes that even at rest she will have some throbbing pain. There is intermittent swelling of any and there is some positional pain based upon weight distribution.  She notes increased pain with stairs  Review of Systems  Eyes: Negative.   Respiratory: Negative.   Cardiovascular: Negative.   Gastrointestinal: Negative.   Musculoskeletal: Positive for myalgias, joint swelling and gait problem.       Objective:   Physical Exam  Nursing note and vitals reviewed. Constitutional: She is oriented to person, place, and time. She appears well-developed and well-nourished. No distress.  HENT:  Head: Normocephalic and atraumatic.  Right Ear: External ear normal.  Left Ear: External ear normal.  Nose: Nose normal.  Mouth/Throat: Oropharynx is clear and moist.  Eyes: Conjunctivae normal and EOM are normal. Pupils are equal, round, and reactive to light.  Neck: Normal range of motion. Neck supple. No JVD present. No tracheal deviation present. No thyromegaly present.  Cardiovascular: Normal heart sounds and intact distal pulses.   No murmur heard. Pulmonary/Chest: Effort normal and breath sounds normal. She has no wheezes. She exhibits no tenderness.  Abdominal: Soft. Bowel sounds are normal.  Musculoskeletal: She exhibits edema and tenderness.  Lymphadenopathy:    She has no cervical adenopathy.  Neurological: She is alert and oriented to person, place, and time. She has normal reflexes. No cranial nerve deficit.  Skin: Skin is warm and dry. She is not diaphoretic.  Psychiatric: She has a normal mood and affect. Her behavior is normal.          Assessment & Plan:    Patient is a morbidly obese female with a history of knee trauma who presents with acute on chronic knee pain now with swelling and decreased mobility.  She gave informed consent for a steroid injection into the knee capsule for both therapeutic and diagnostic purposes we informed her that if she has no response to the injection it is probably because she has lost most of her cartilage and will require an orthopedic consult she is a good response it may offer to be pain free for up to 3-4 months. We discussed that this injection was a temporary measure and that the ultimate answer may be more radical knee therapy.  Informed consent obtained and the patient's knee was prepped with betadine. Local anesthesia was obtained with topical spray. Then 40 mg of Depo-Medrol and 1/2 cc of lidocaine was injected into the joint space. The patient tolerated the procedure without complications. Post injection care discussed with patient.

## 2012-07-11 ENCOUNTER — Telehealth: Payer: Self-pay | Admitting: Internal Medicine

## 2012-07-11 NOTE — Telephone Encounter (Signed)
Caller: Sue/Patient; Patient Name: Terese Door; PCP: Darryll Capers (Adults only); Best Callback Phone Number: [phone] Prior injury to Left knee years ago.  Has had pain in knee for longer than one month.  Seen in office Tues 10/1 and had injection into knee.  Pain level has gone from moderate to severe prior to visit   to currrenly feeling pain is now  mild to moderate.  If climbing stairs or into [place] will increase pain.  Mild swelling medially on knee.  Did not apply ice after injection.   Knee still popping and cracking when walking as at visit.  Triaged in Knee Pain Non-injury Guideline - Disposition:  See Provider Within 2 Weeks due to mobility decreasing from known or unknown cause.  Reviewed care over weekend, will call office on Monday for appointment if does not continue to improve or if it is not where she thinks it should be.

## 2012-07-22 ENCOUNTER — Encounter: Payer: Self-pay | Admitting: Gastroenterology

## 2012-07-25 ENCOUNTER — Encounter (HOSPITAL_COMMUNITY): Payer: Self-pay | Admitting: Emergency Medicine

## 2012-07-25 ENCOUNTER — Emergency Department (HOSPITAL_COMMUNITY)
Admission: EM | Admit: 2012-07-25 | Discharge: 2012-07-25 | Disposition: A | Discharge status: Home / Self Care | Payer: Managed Care, Other (non HMO) | Source: Home / Self Care | Attending: Family Medicine | Admitting: Family Medicine

## 2012-07-25 DIAGNOSIS — Z23 Encounter for immunization: Secondary | ICD-10-CM

## 2012-07-25 DIAGNOSIS — S61209A Unspecified open wound of unspecified finger without damage to nail, initial encounter: Secondary | ICD-10-CM

## 2012-07-25 DIAGNOSIS — S61218A Laceration without foreign body of other finger without damage to nail, initial encounter: Secondary | ICD-10-CM

## 2012-07-25 MED ORDER — TETANUS-DIPHTH-ACELL PERTUSSIS 5-2.5-18.5 LF-MCG/0.5 IM SUSP
0.5000 mL | Freq: Once | INTRAMUSCULAR | Status: AC
  Administered 2012-07-25: 0.5 mL via INTRAMUSCULAR

## 2012-07-25 MED ORDER — TETANUS-DIPHTH-ACELL PERTUSSIS 5-2.5-18.5 LF-MCG/0.5 IM SUSP
INTRAMUSCULAR | Status: AC
  Filled 2012-07-25: qty 0.5

## 2012-07-25 NOTE — ED Provider Notes (Signed)
History     CSN: [id number]  Arrival date & time 07/25/12  1142   First MD Initiated Contact with Patient 07/25/12 1247      Chief Complaint  Patient presents with  . Laceration    (Consider location/radiation/quality/duration/timing/severity/associated sxs/prior treatment) Patient is a 61 y.o. female presenting with skin laceration. The history is provided by the patient.  Laceration  The incident occurred 1 to 2 hours ago. The laceration is located on the left hand. The laceration is 2 cm in size. The laceration mechanism was a a clean knife (cut on rotary cloth cutting machine while making a quilt.). The pain is mild. Her tetanus status is out of date.    Past Medical History  Diagnosis Date  . Hyperlipidemia   . Hypertension   . Depression   . Thyroid disease     Past Surgical History  Procedure Date  . Appendectomy   . Colonoscopy     Family History  Problem Relation Age of Onset  . Hyperlipidemia Mother   . Heart disease Mother   . Cancer Father     colon    History  Substance Use Topics  . Smoking status: Never Smoker   . Smokeless tobacco: Not on file  . Alcohol Use: No    OB History    Grav Para Term Preterm Abortions TAB SAB Ect Mult Living                  Review of Systems  Constitutional: Negative.   Skin: Positive for wound.    Allergies  Erythromycin and Penicillins  Home Medications   Current Outpatient Rx  Name Route Sig Dispense Refill  . BENICAR HCT 40-25 MG PO TABS  TAKE 1 TABLET DAILY 90 tablet 3  . SYNTHROID 200 MCG PO TABS  TAKE 1 TABLET DAILY 90 tablet 3  . ACTONEL 150 MG PO TABS  TAKE 1 TABLET MONTHLY 3 tablet 3  . ACYCLOVIR 5 % EX OINT Topical Apply topically as needed.      Marland Kitchen BETAMETHASONE DIPROPIONATE AUG 0.05 % EX CREA Topical Apply topically 2 (two) times daily.      Marland Kitchen CALCIUM CARBONATE 600 MG PO TABS Oral Take 600 mg by mouth 2 (two) times daily with a meal.      . CYMBALTA 30 MG PO CPEP  TAKE 1 CAPSULE DAILY 90  each 3  . ICAPS MV PO Oral Take by mouth daily.      . RED YEAST RICE 600 MG PO CAPS Oral Take by mouth.        BP 137/52  Pulse 82  Temp 98.6 F (37 C) (Oral)  Resp 16  SpO2 98%  Physical Exam  Nursing note and vitals reviewed. Constitutional: She is oriented to person, place, and time. She appears well-developed and well-nourished.  Musculoskeletal: She exhibits tenderness.       1.5 cm skin flap avulsion to tip of lif, ,superficial, continues to bleed, no bone exposed.  Neurological: She is alert and oriented to person, place, and time.  Skin: Skin is warm and dry.    ED Course  LACERATION REPAIR Date/Time: 07/25/2012 1:31 PM Performed by: Linna Hoff Authorized by: Bradd Canary D Consent: Verbal consent obtained. Consent given by: patient Laceration length: 1.5 cm Foreign bodies: no foreign bodies Tendon involvement: none Nerve involvement: none Vascular damage: no Local anesthetic: lidocaine 2% without epinephrine Anesthetic total: 1 ml Patient sedated: no Preparation: Patient was prepped and draped in  the usual sterile fashion. Irrigation solution: tap water Amount of cleaning: standard Debridement: none Degree of undermining: none Wound skin closure material used: missing skin avulsion, cauterized for hemostasis. Dressing: non-adhesive packing strip   (including critical care time)  Labs Reviewed - No data to display No results found.   1. Laceration of finger, index       MDM  Lac repair.        Linna Hoff, MD 07/25/12 [phone]

## 2012-07-25 NOTE — ED Notes (Signed)
Pt c/o laceration to left index finger since 10:00 this morning... Pt has not stopped bleeding... Cut her finger w/a rotary cutter?? while cutting fabric... Sx include: pain... Denies: fevers, nausea, vomiting, diarrhea... Pt is alert w/no signs of distress.

## 2012-10-16 ENCOUNTER — Other Ambulatory Visit: Payer: Self-pay | Admitting: *Deleted

## 2012-10-16 MED ORDER — OLMESARTAN MEDOXOMIL-HCTZ 40-25 MG PO TABS: 1.0000 | ORAL_TABLET | Freq: Every day | ORAL | Status: DC

## 2012-11-26 ENCOUNTER — Other Ambulatory Visit: Payer: Self-pay | Admitting: Internal Medicine

## 2013-03-31 ENCOUNTER — Telehealth: Payer: Self-pay | Admitting: Internal Medicine

## 2013-03-31 NOTE — Telephone Encounter (Signed)
Patient Information:  Caller Name: Pearly  Phone: [phone]  Patient: [name], [name]  Gender: Female  DOB: [birth year]  Age: 62 Years  PCP: Darryll Capers (Adults only)  Office Follow Up:  Does the office need to follow up with this patient?: Yes  Instructions For The Office: Call back needed regarding BP medication or follow up.  RN Note:  Takes Benicar every AM between 0630 and 0730. Current weight 225; previous weight was 258. Last office visit 07/08/12.  Please call back to advise regarding BP medication and or follow up.  Symptoms  Reason For Call & Symptoms: Dizziness over past 3 weeks; worse when active.  Asking if needs to decrease BP meds since intentionally lost 30 lbs over past several months. BP range 91-113/54-62 during the day.  Reviewed Health History In EMR: Yes  Reviewed Medications In EMR: Yes  Reviewed Allergies In EMR: Yes  Reviewed Surgeries / Procedures: Yes  Date of Onset of Symptoms: 03/10/2013  Treatments Tried: Sits down, elevating legs  Treatments Tried Worked: Yes  Guideline(s) Used:  Dizziness  Disposition Per Guideline:   Discuss with PCP and Callback by Nurse Today  Reason For Disposition Reached:   Taking a medicine that could cause dizziness (e.g., blood pressure medications, diuretics)  Advice Given:  Some Causes of Temporary Dizziness:  Poor Fluid Intake - Not drinking enough fluids and being a little dehydrated is a common cause of temporary dizziness. This is always worse during hot weather.  Standing Up Suddenly - Standing up suddenly (especially getting out of bed) or prolonged standing in one place are common causes of temporary dizziness. Not drinking enough fluids always makes it worse. Certain medications can cause or increase this type of dizziness (e.g., blood pressure medications).  Drink Fluids:  Drink several glasses of fruit juice, other clear fluids, or water. This will improve hydration and blood glucose. If you have a fever  or have had heat exposure, make sure the fluids are cold.  Rest for 1-2 Hours:  Lie down with feet elevated for 1 hour. This will improve blood flow and increase blood flow to the brain.  Stand Up Slowly:  In the mornings, sit up for a few minutes before you stand up. That will help your blood flow make the adjustment.  Sit down or lie down if you feel dizzy.  Patient Will Follow Care Advice:  YES

## 2013-03-31 NOTE — Telephone Encounter (Signed)
Ov with padonda tomorrow

## 2013-04-01 ENCOUNTER — Encounter: Payer: Self-pay | Admitting: Family

## 2013-04-01 ENCOUNTER — Ambulatory Visit (INDEPENDENT_AMBULATORY_CARE_PROVIDER_SITE_OTHER): Payer: BC Managed Care – PPO | Admitting: Family

## 2013-04-01 VITALS — BP 104/70 | HR 67 | Wt 226.0 lb

## 2013-04-01 DIAGNOSIS — I10 Essential (primary) hypertension: Secondary | ICD-10-CM

## 2013-04-01 DIAGNOSIS — I959 Hypotension, unspecified: Secondary | ICD-10-CM

## 2013-04-01 DIAGNOSIS — R42 Dizziness and giddiness: Secondary | ICD-10-CM

## 2013-04-01 NOTE — Patient Instructions (Addendum)

## 2013-04-01 NOTE — Progress Notes (Signed)
Subjective:    Patient ID: [name], female    DOB: 1950/11/21, 62 y.o.   MRN: [id number]  HPI Pt is a 62 year old white female who presents to PCP with dizziness and light headedness x 2 weeks. Pt associates symptoms with a recent 30lb weight loss and her BP medicine. Pt states sx start about three hours after she has taken BP medicine. Reports movement initiates the symptoms and rest relieves the symptoms.   Review of Systems  Constitutional: Negative.   HENT: Negative.   Eyes: Negative.   Respiratory: Negative.   Cardiovascular: Negative.   Gastrointestinal: Negative.   Endocrine: Negative.   Genitourinary: Negative.   Musculoskeletal: Negative.   Skin: Negative.   Allergic/Immunologic: Negative.   Neurological: Positive for dizziness and light-headedness.  Hematological: Negative.   Psychiatric/Behavioral: Negative.    Past Medical History  Diagnosis Date  . Hyperlipidemia   . Hypertension   . Depression   . Thyroid disease     History   Social History  . Marital Status: Married    Spouse Name: N/A    Number of Children: N/A  . Years of Education: N/A   Occupational History  . Not on file.   Social History Main Topics  . Smoking status: Never Smoker   . Smokeless tobacco: Not on file  . Alcohol Use: No  . Drug Use: No  . Sexually Active: Yes   Other Topics Concern  . Not on file   Social History Narrative  . No narrative on file    Past Surgical History  Procedure Laterality Date  . Appendectomy    . Colonoscopy      Family History  Problem Relation Age of Onset  . Hyperlipidemia Mother   . Heart disease Mother   . Cancer Father     colon    Allergies  Allergen Reactions  . Erythromycin     REACTION: Rash  . Penicillins     REACTION: Hives    Current Outpatient Prescriptions on File Prior to Visit  Medication Sig Dispense Refill  . ACTONEL 150 MG tablet TAKE 1 TABLET MONTHLY  3 tablet  3  . acyclovir (ZOVIRAX) 5 % ointment  Apply topically as needed.        . calcium carbonate (OS-CAL) 600 MG TABS Take 600 mg by mouth 2 (two) times daily with a meal.        . Multiple Vitamins-Minerals (ICAPS MV PO) Take by mouth daily.        Marland Kitchen olmesartan-hydrochlorothiazide (BENICAR HCT) 40-25 MG per tablet Take 1 tablet by mouth daily.  90 tablet  3  . Red Yeast Rice 600 MG CAPS Take by mouth.        . SYNTHROID 200 MCG tablet TAKE 1 TABLET DAILY  90 tablet  3  . augmented betamethasone dipropionate (DIPROLENE-AF) 0.05 % cream Apply topically 2 (two) times daily.        . CYMBALTA 30 MG capsule TAKE 1 CAPSULE DAILY  90 each  3   No current facility-administered medications on file prior to visit.    BP 104/70  Pulse 67  Wt 226 lb (102.513 kg)  BMI 37.61 kg/m2  SpO2 97%chart    Objective:   Physical Exam  Constitutional: She is oriented to person, place, and time. She appears well-developed and well-nourished.  HENT:  Head: Normocephalic and atraumatic.  Eyes: Pupils are equal, round, and reactive to light.  Neck: Normal range of motion.  Cardiovascular: Normal rate and regular rhythm.   Pulmonary/Chest: Effort normal and breath sounds normal.  Abdominal: Soft. Bowel sounds are normal.  Musculoskeletal: Normal range of motion.  Neurological: She is alert and oriented to person, place, and time.  Skin: Skin is warm and dry.          Assessment & Plan:  1. Dizziness 2. Hypertension  Pt's BP meds have been adjusted by lowering dose in 1/2 to see if this will relieve her symptoms. Pt to follow up with PCP in 2 weeks or sooner with any questions or concerns.

## 2013-04-14 ENCOUNTER — Ambulatory Visit (INDEPENDENT_AMBULATORY_CARE_PROVIDER_SITE_OTHER): Payer: BC Managed Care – PPO | Admitting: Family

## 2013-04-14 ENCOUNTER — Encounter: Payer: Self-pay | Admitting: Family

## 2013-04-14 VITALS — BP 100/76 | HR 69 | Wt 228.0 lb

## 2013-04-14 DIAGNOSIS — R42 Dizziness and giddiness: Secondary | ICD-10-CM

## 2013-04-14 DIAGNOSIS — I959 Hypotension, unspecified: Secondary | ICD-10-CM

## 2013-04-14 NOTE — Progress Notes (Signed)
Subjective:    Patient ID: [name], female    DOB: 1950-12-15, 62 y.o.   MRN: [id number]  HPI Pt is a 62 year old white female who presents to PCP for 2 week follow up for episodes of dizziness. At previous visit pt's Benicar dosage was reduced to 1/2. Since the change in dosage pt states symptoms of dizziness have improved, stating that they are not as intense but are still present causing light headiness and decrease in activity.    Review of Systems  Constitutional: Negative.   HENT: Negative.   Eyes: Negative.   Respiratory: Negative.   Cardiovascular: Negative.   Gastrointestinal: Negative.   Endocrine: Negative.   Genitourinary: Negative.   Musculoskeletal: Negative.   Skin: Negative.   Allergic/Immunologic: Negative.   Neurological: Positive for light-headedness.  Hematological: Negative.   Psychiatric/Behavioral: Negative.        Past Medical History  Diagnosis Date  . Hyperlipidemia   . Hypertension   . Depression   . Thyroid disease     History   Social History  . Marital Status: Married    Spouse Name: N/A    Number of Children: N/A  . Years of Education: N/A   Occupational History  . Not on file.   Social History Main Topics  . Smoking status: Never Smoker   . Smokeless tobacco: Not on file  . Alcohol Use: No  . Drug Use: No  . Sexually Active: Yes   Other Topics Concern  . Not on file   Social History Narrative  . No narrative on file    Past Surgical History  Procedure Laterality Date  . Appendectomy    . Colonoscopy      Family History  Problem Relation Age of Onset  . Hyperlipidemia Mother   . Heart disease Mother   . Cancer Father     colon    Allergies  Allergen Reactions  . Erythromycin     REACTION: Rash  . Penicillins     REACTION: Hives    Current Outpatient Prescriptions on File Prior to Visit  Medication Sig Dispense Refill  . ACTONEL 150 MG tablet TAKE 1 TABLET MONTHLY  3 tablet  3  . acyclovir  (ZOVIRAX) 5 % ointment Apply topically as needed.        Marland Kitchen augmented betamethasone dipropionate (DIPROLENE-AF) 0.05 % cream Apply topically 2 (two) times daily.        . calcium carbonate (OS-CAL) 600 MG TABS Take 600 mg by mouth 2 (two) times daily with a meal.        . Multiple Vitamins-Minerals (ICAPS MV PO) Take by mouth daily.        Marland Kitchen olmesartan-hydrochlorothiazide (BENICAR HCT) 40-25 MG per tablet Take 1 tablet by mouth daily.  90 tablet  3  . Red Yeast Rice 600 MG CAPS Take by mouth.        . SYNTHROID 200 MCG tablet TAKE 1 TABLET DAILY  90 tablet  3   No current facility-administered medications on file prior to visit.    BP 100/76  Pulse 69  Wt 228 lb (103.42 kg)  BMI 37.94 kg/m2  SpO2 98%chart Objective:   Physical Exam  Constitutional: She is oriented to person, place, and time. She appears well-developed and well-nourished.  HENT:  Head: Normocephalic and atraumatic.  Eyes: Pupils are equal, round, and reactive to light.  Neck: Normal range of motion. Neck supple.  Cardiovascular: Normal rate and regular rhythm.  Pulmonary/Chest: Effort normal and breath sounds normal.  Abdominal: Soft. Bowel sounds are normal.  Musculoskeletal: Normal range of motion.  Neurological: She is alert and oriented to person, place, and time.  Skin: Skin is warm and dry.          Assessment & Plan:  1. Dizziness 2. Hypotension  Pt instructed to d/c anti-hypertension medicine. Pt is to routinely check BP at home. She is instructed to follow up with PCP in 3-4 weeks to assess the status of her HTN. Pt instructed to follow up with PCP with sooner if symptoms worsen or persist or with any questions/concerns.  Note by: S.Keah, FNP Student

## 2013-05-04 ENCOUNTER — Ambulatory Visit (INDEPENDENT_AMBULATORY_CARE_PROVIDER_SITE_OTHER): Payer: BC Managed Care – PPO | Admitting: Family Medicine

## 2013-05-04 ENCOUNTER — Encounter: Payer: Self-pay | Admitting: Family Medicine

## 2013-05-04 ENCOUNTER — Telehealth: Payer: Self-pay | Admitting: Internal Medicine

## 2013-05-04 VITALS — BP 124/84 | Temp 98.1°F | Wt 228.0 lb

## 2013-05-04 DIAGNOSIS — E039 Hypothyroidism, unspecified: Secondary | ICD-10-CM

## 2013-05-04 DIAGNOSIS — I10 Essential (primary) hypertension: Secondary | ICD-10-CM

## 2013-05-04 DIAGNOSIS — I872 Venous insufficiency (chronic) (peripheral): Secondary | ICD-10-CM

## 2013-05-04 LAB — BASIC METABOLIC PANEL
BUN: 13 mg/dL (ref 6–23)
GFR: 91.8 mL/min (ref >60.00)
Potassium: 4.2 mEq/L (ref 3.5–5.1)
Sodium: 142 mEq/L (ref 135–145)

## 2013-05-04 LAB — TSH: TSH: 0.03 u[IU]/mL — ABNORMAL LOW (ref 0.35–5.50)

## 2013-05-04 LAB — LIPID PANEL
HDL: 47.4 mg/dL (ref >39.00)
Triglycerides: 86 mg/dL (ref 0.0–149.0)

## 2013-05-04 MED ORDER — FUROSEMIDE 20 MG PO TABS: 20.0000 mg | ORAL_TABLET | Freq: Every day | ORAL | Status: DC

## 2013-05-04 NOTE — Progress Notes (Signed)
Chief Complaint  Patient presents with  . bilateral feet and ankle swelling    about 10 days; per pt worse the last 2 days     HPI:  62 yo female pt of Dr. Lovell Sheehan here for acute visit for bilat le edema: -per pt used to be on diuretic (benicar hctz) but this was making her dizzy so stopped a few weeks ago -per PCP notes had LE edema in 07/2012 -pt repots long hx of LE edema worse in the summer - she has been out working in garden daily and swelling is worse after being on fee tin the heat -denies: SOB, DOE, cough, CP, palpitations -does not where compression stockings -reports has not had labs in some time -had high sodium meal recently   ROS: See pertinent positives and negatives per HPI.  Past Medical History  Diagnosis Date  . Hyperlipidemia   . Hypertension   . Depression   . Thyroid disease     Family History  Problem Relation Age of Onset  . Hyperlipidemia Mother   . Heart disease Mother   . Cancer Father     colon    History   Social History  . Marital Status: Married    Spouse Name: N/A    Number of Children: N/A  . Years of Education: N/A   Social History Main Topics  . Smoking status: Never Smoker   . Smokeless tobacco: None  . Alcohol Use: No  . Drug Use: No  . Sexually Active: Yes   Other Topics Concern  . None   Social History Narrative  . None    Current outpatient prescriptions:ACTONEL 150 MG tablet, TAKE 1 TABLET MONTHLY, Disp: 3 tablet, Rfl: 3;  acyclovir (ZOVIRAX) 5 % ointment, Apply topically as needed.  , Disp: , Rfl: ;  calcium carbonate (OS-CAL) 600 MG TABS, Take 600 mg by mouth 2 (two) times daily with a meal.  , Disp: , Rfl: ;  Multiple Vitamins-Minerals (ICAPS MV PO), Take by mouth daily.  , Disp: , Rfl: ;  Red Yeast Rice 600 MG CAPS, Take by mouth.  , Disp: , Rfl:  SYNTHROID 200 MCG tablet, TAKE 1 TABLET DAILY, Disp: 90 tablet, Rfl: 3;  augmented betamethasone dipropionate (DIPROLENE-AF) 0.05 % cream, Apply topically 2 (two) times  daily.  , Disp: , Rfl: ;  furosemide (LASIX) 20 MG tablet, Take 1 tablet (20 mg total) by mouth daily., Disp: 30 tablet, Rfl: 0  EXAM:  Filed Vitals:   05/04/13 0926  BP: 124/84  Temp: 98.1 F (36.7 C)    Body mass index is 37.94 kg/(m^2).  GENERAL: vitals reviewed and listed above, alert, oriented, appears well hydrated and in no acute distress  HEENT: atraumatic, conjunttiva clear, no obvious abnormalities on inspection of external nose and ears  NECK: no obvious masses on inspection, no JVD  LUNGS: clear to auscultation bilaterally, no wheezes, rales or rhonchi, good air movement  CV: HRRR, bilat + LE pitting edema  MS: moves all extremities without noticeable abnormality  PSYCH: pleasant and cooperative, no obvious depression or anxiety  ASSESSMENT AND PLAN:  Discussed the following assessment and plan:  HYPOTHYROIDISM - Plan: TSH  Morbid obesity - Plan: Lipid panel  HYPERTENSION - Plan: Basic metabolic panel, TSH  Chronic venous insufficiency - Plan: Basic metabolic panel, TSH, furosemide (LASIX) 20 MG tablet  -seems like a chronic issue with sign of venous stasis on exam and not signs or symptoms of heart failure - worsening since  stopping diuretic, weight 228 which is the same as a few weeks ago -advised compression, elevation and short course low dose lasix - discussed risks/benefits -rx provided for compression stockings -sjhe has not had basic labs or routine follow up in a long time - labs per orders and she is to follow up with PCP at next available apptoinemnt -Patient advised to return or notify a doctor immediately if symptoms worsen or persist or new concerns arise.  Patient Instructions  -We have ordered labs or studies at this visit. It can take up to 1-2 weeks for results and processing. We will contact you with instructions IF your results are abnormal. Normal results will be released to your [facility]. If you have not heard from [place] or can not find your  results in [facility] in 2 weeks please contact our office.  -you need routine follow up with your doctor (Dr. York Ram) at next available appointment in 1-2 months   -use the lasix once daily in the morning for the next 3 days and when swelling in feet is bad  -use compression stockings daily and elevated feet above heart for 30 minutes 2x daily when having issues with swelling  -low salt diet              KIM, HANNAH R.

## 2013-05-04 NOTE — Telephone Encounter (Signed)
Patient Information:  Caller Name: Cheronda  Phone: [phone]  Patient: Martha, Taylor  Gender: Female  DOB: [birth year]  Age: 62 Years  PCP: Darryll Capers (Adults only)  Office Follow Up:  Does the office need to follow up with this patient?: No  Instructions For The Office: N/A  RN Note:  Bilateral edema of feet and ankles for past 10 days; swelling worse in last 2 days and slightly worse on left foot/ankle. Seen 04/14/13 for hypotension. Benicar stopped.  BP 130/72 left arm at 0730. Edema makes it painful to wear shoes.  Symptoms  Reason For Call & Symptoms: Significant edema of feet of ankles.  Reviewed Health History In EMR: Yes  Reviewed Medications In EMR: Yes  Reviewed Allergies In EMR: Yes  Reviewed Surgeries / Procedures: Yes  Date of Onset of Symptoms: 04/24/2013  Treatments Tried: elevating legs to hip level  Treatments Tried Worked: No  Guideline(s) Used:  Leg Swelling and Edema  Disposition Per Guideline:   See Today in Office  Reason For Disposition Reached:   Moderate swelling of both ankles (e.g., swelling extends up to the knees) AND new onset or worsening  Advice Given:  Call Back If:  Swelling becomes worse  Swelling becomes red or painful to the touch  Calf pain occurs and becomes constant  You become worse.  Patient Will Follow Care Advice:  YES  Appointment Scheduled:  05/04/2013 09:15:00 Appointment Scheduled Provider:  Kriste Basque [facility])

## 2013-05-04 NOTE — Patient Instructions (Signed)
-  We have ordered labs or studies at this visit. It can take up to 1-2 weeks for results and processing. We will contact you with instructions IF your results are abnormal. Normal results will be released to your [facility]. If you have not heard from [place] or can not find your results in [facility] in 2 weeks please contact our office.  -you need routine follow up with your doctor (Dr. York Ram) at next available appointment in 1-2 months   -use the lasix once daily in the morning for the next 3 days and when swelling in feet is bad  -use compression stockings daily and elevated feet above heart for 30 minutes 2x daily when having issues with swelling  -low salt diet

## 2013-05-05 NOTE — Addendum Note (Signed)
Addended by: Earle Gell C on: 05/05/2013 04:00 PM   Modules accepted: Orders

## 2013-05-05 NOTE — Progress Notes (Signed)
Quick Note:  Called and spoke with pt and pt is aware. Appt made for labs; pt is aware. ______

## 2013-05-07 ENCOUNTER — Other Ambulatory Visit: Payer: BC Managed Care – PPO

## 2013-05-14 ENCOUNTER — Other Ambulatory Visit: Payer: BC Managed Care – PPO

## 2013-05-18 ENCOUNTER — Ambulatory Visit: Payer: BC Managed Care – PPO | Admitting: Family

## 2013-05-19 ENCOUNTER — Other Ambulatory Visit (INDEPENDENT_AMBULATORY_CARE_PROVIDER_SITE_OTHER): Payer: BC Managed Care – PPO

## 2013-05-19 DIAGNOSIS — E039 Hypothyroidism, unspecified: Secondary | ICD-10-CM

## 2013-05-19 LAB — T4, FREE: Free T4: 1.55 ng/dL (ref 0.60–1.60)

## 2013-05-20 NOTE — Progress Notes (Signed)
Quick Note:  Called and spoke with pt and pt is aware. ______ 

## 2013-08-12 ENCOUNTER — Encounter: Payer: Self-pay | Admitting: Internal Medicine

## 2013-08-12 ENCOUNTER — Ambulatory Visit (INDEPENDENT_AMBULATORY_CARE_PROVIDER_SITE_OTHER): Payer: BC Managed Care – PPO | Admitting: Internal Medicine

## 2013-08-12 VITALS — BP 140/80 | HR 76 | Temp 98.0°F | Resp 16 | Ht 65.0 in | Wt 222.0 lb

## 2013-08-12 DIAGNOSIS — I10 Essential (primary) hypertension: Secondary | ICD-10-CM

## 2013-08-12 DIAGNOSIS — E785 Hyperlipidemia, unspecified: Secondary | ICD-10-CM

## 2013-08-12 DIAGNOSIS — Z23 Encounter for immunization: Secondary | ICD-10-CM

## 2013-08-12 DIAGNOSIS — L719 Rosacea, unspecified: Secondary | ICD-10-CM | POA: Insufficient documentation

## 2013-08-12 DIAGNOSIS — E039 Hypothyroidism, unspecified: Secondary | ICD-10-CM

## 2013-08-12 DIAGNOSIS — M810 Age-related osteoporosis without current pathological fracture: Secondary | ICD-10-CM

## 2013-08-12 LAB — BASIC METABOLIC PANEL
CO2: 32 mEq/L (ref 19–32)
Chloride: 102 mEq/L (ref 96–112)
Creatinine, Ser: 0.7 mg/dL (ref 0.4–1.2)

## 2013-08-12 LAB — HEPATIC FUNCTION PANEL
ALT: 26 U/L (ref 0–35)
Bilirubin, Direct: 0.1 mg/dL (ref 0.0–0.3)
Total Protein: 7.6 g/dL (ref 6.0–8.3)

## 2013-08-12 LAB — LIPID PANEL
Total CHOL/HDL Ratio: 4
Triglycerides: 91 mg/dL (ref 0.0–149.0)

## 2013-08-12 LAB — TSH: TSH: 0.08 u[IU]/mL — ABNORMAL LOW (ref 0.35–5.50)

## 2013-08-12 NOTE — Progress Notes (Signed)
  Subjective:    Patient ID: [name], female    DOB: 09-25-1951, 62 y.o.   MRN: [id number]  HPI    Review of Systems     Objective:   Physical Exam        Assessment & Plan:

## 2013-08-12 NOTE — Progress Notes (Signed)
  Subjective:    Patient ID: [name], female    DOB: Apr 04, 1951, 62 y.o.   MRN: [id number]  HPI Pre-visit discussion using our clinic review tool. No additional management support is needed unless otherwise documented below in the visit note.   Review of Systems     Objective:   Physical Exam        Assessment & Plan:

## 2013-08-12 NOTE — Progress Notes (Signed)
Subjective:    Patient ID: [name], female    DOB: [birth year], 62 y.o.   MRN: [id number]  HPI Patient history 62 year old female who is followed for hypertension hyperlipidemia and hypothyroidism she presents today for routine monitoring she has lost approximately 6 pounds her blood pressure is stable she does not take the Lasix on an everyday basis but only as needed for increased peripheral edema.  He also has a history of osteoporosis has been on Actonel and we will need to check renal function and a vitamin D level   Review of Systems  Constitutional: Negative for activity change, appetite change and fatigue.  HENT: Negative for congestion, ear pain, postnasal drip and sinus pressure.   Eyes: Negative for redness and visual disturbance.  Respiratory: Negative for cough, shortness of breath and wheezing.   Gastrointestinal: Negative for abdominal pain and abdominal distention.  Genitourinary: Negative for dysuria, frequency and menstrual problem.  Musculoskeletal: Negative for arthralgias, joint swelling, myalgias and neck pain.  Skin: Negative for rash and wound.  Neurological: Negative for dizziness, weakness and headaches.  Hematological: Negative for adenopathy. Does not bruise/bleed easily.  Psychiatric/Behavioral: Negative for sleep disturbance and decreased concentration.   Past Medical History  Diagnosis Date  . Hyperlipidemia   . Hypertension   . Depression   . Thyroid disease     History   Social History  . Marital Status: Married    Spouse Name: N/A    Number of Children: N/A  . Years of Education: N/A   Occupational History  . Not on file.   Social History Main Topics  . Smoking status: Never Smoker   . Smokeless tobacco: Not on file  . Alcohol Use: No  . Drug Use: No  . Sexual Activity: Yes   Other Topics Concern  . Not on file   Social History Narrative  . No narrative on file    Past Surgical History  Procedure Laterality Date  .  Appendectomy    . Colonoscopy      Family History  Problem Relation Age of Onset  . Hyperlipidemia Mother   . Heart disease Mother   . Cancer Father     colon    Allergies  Allergen Reactions  . Erythromycin     REACTION: Rash  . Penicillins     REACTION: Hives    Current Outpatient Prescriptions on File Prior to Visit  Medication Sig Dispense Refill  . ACTONEL 150 MG tablet TAKE 1 TABLET MONTHLY  3 tablet  3  . acyclovir (ZOVIRAX) 5 % ointment Apply topically as needed.        . calcium carbonate (OS-CAL) 600 MG TABS Take 600 mg by mouth 2 (two) times daily with a meal.        . Multiple Vitamins-Minerals (ICAPS MV PO) Take by mouth daily.        . Red Yeast Rice 600 MG CAPS Take by mouth.        . SYNTHROID 200 MCG tablet TAKE 1 TABLET DAILY  90 tablet  3   No current facility-administered medications on file prior to visit.    BP 140/80  Pulse 76  Temp(Src) 98 F (36.7 C)  Resp 16  Ht 5\' 5"  (1.651 m)  Wt 222 lb (100.699 kg)  BMI 36.94 kg/m2       Objective:   Physical Exam  Constitutional: She is oriented to person, place, and time. She appears well-developed and well-nourished. No distress.  HENT:  Head: Normocephalic and atraumatic.  Eyes: Conjunctivae and EOM are normal. Pupils are equal, round, and reactive to light.  Neck: Normal range of motion. Neck supple. No JVD present. No tracheal deviation present. No thyromegaly present.  Cardiovascular: Normal rate and regular rhythm.   Murmur heard. Pulmonary/Chest: Effort normal and breath sounds normal. She has no wheezes. She exhibits no tenderness.  Abdominal: Soft. Bowel sounds are normal.  Musculoskeletal: Normal range of motion. She exhibits no edema and no tenderness.  Lymphadenopathy:    She has no cervical adenopathy.  Neurological: She is alert and oriented to person, place, and time. She has normal reflexes. No cranial nerve deficit.  Skin: Skin is warm and dry. She is not diaphoretic.   Psychiatric: She has a normal mood and affect. Her behavior is normal.          Assessment & Plan:  Appropriate screening today includes a basic metabolic panel from the use of Lasix to monitor her potassium and renal function.  A TSH on 200 mcg of Synthroid.  A basic metabolic panel for use of diuretic.  In the liver panel for potential side effects of any of the medications.  She was encouraged to set up for my chart so that we can communicate directly these

## 2013-08-12 NOTE — Patient Instructions (Signed)
Call to report the cream effect for the rosacea

## 2013-08-13 LAB — VITAMIN D 25 HYDROXY (VIT D DEFICIENCY, FRACTURES): Vit D, 25-Hydroxy: 29 ng/mL — ABNORMAL LOW (ref 30–89)

## 2013-09-20 ENCOUNTER — Encounter: Payer: Self-pay | Admitting: Internal Medicine

## 2014-02-09 ENCOUNTER — Other Ambulatory Visit: Payer: Self-pay | Admitting: Internal Medicine

## 2014-02-10 ENCOUNTER — Ambulatory Visit: Payer: BC Managed Care – PPO | Admitting: Internal Medicine

## 2014-02-17 ENCOUNTER — Telehealth: Payer: Self-pay | Admitting: Family Medicine

## 2014-02-19 MED ORDER — FUROSEMIDE 20 MG PO TABS: 20.0000 mg | ORAL_TABLET | Freq: Every day | ORAL | Status: DC | PRN

## 2014-02-19 NOTE — Telephone Encounter (Signed)
Pt states she just contacted the pharmacy and they stated they do not have the rx for the pt. Lasix 20 mg.

## 2014-03-19 ENCOUNTER — Ambulatory Visit: Payer: BC Managed Care – PPO | Admitting: Internal Medicine

## 2014-05-10 ENCOUNTER — Other Ambulatory Visit: Payer: Self-pay | Admitting: Internal Medicine

## 2014-10-08 HISTORY — PX: COLONOSCOPY: SHX174

## 2014-10-08 HISTORY — PX: POLYPECTOMY: SHX149

## 2015-04-04 DIAGNOSIS — I10 Essential (primary) hypertension: Secondary | ICD-10-CM | POA: Diagnosis not present

## 2015-04-04 DIAGNOSIS — T63001A Toxic effect of unspecified snake venom, accidental (unintentional), initial encounter: Secondary | ICD-10-CM | POA: Diagnosis present

## 2015-04-04 DIAGNOSIS — Y999 Unspecified external cause status: Secondary | ICD-10-CM | POA: Insufficient documentation

## 2015-04-04 DIAGNOSIS — Z79899 Other long term (current) drug therapy: Secondary | ICD-10-CM | POA: Insufficient documentation

## 2015-04-04 DIAGNOSIS — Y9301 Activity, walking, marching and hiking: Secondary | ICD-10-CM | POA: Diagnosis not present

## 2015-04-04 DIAGNOSIS — E079 Disorder of thyroid, unspecified: Secondary | ICD-10-CM | POA: Diagnosis not present

## 2015-04-04 DIAGNOSIS — Y929 Unspecified place or not applicable: Secondary | ICD-10-CM | POA: Insufficient documentation

## 2015-04-04 DIAGNOSIS — E876 Hypokalemia: Secondary | ICD-10-CM | POA: Diagnosis not present

## 2015-04-04 DIAGNOSIS — Z880 Allergy status to penicillin: Secondary | ICD-10-CM | POA: Diagnosis not present

## 2015-04-04 DIAGNOSIS — Z8659 Personal history of other mental and behavioral disorders: Secondary | ICD-10-CM | POA: Insufficient documentation

## 2015-04-05 ENCOUNTER — Encounter (HOSPITAL_COMMUNITY): Payer: Self-pay | Admitting: Emergency Medicine

## 2015-04-05 ENCOUNTER — Emergency Department (HOSPITAL_COMMUNITY)
Admission: EM | Admit: 2015-04-05 | Discharge: 2015-04-05 | Disposition: A | Discharge status: Home / Self Care | Payer: BLUE CROSS/BLUE SHIELD | Attending: Emergency Medicine | Admitting: Emergency Medicine

## 2015-04-05 DIAGNOSIS — T63001A Toxic effect of unspecified snake venom, accidental (unintentional), initial encounter: Secondary | ICD-10-CM

## 2015-04-05 LAB — COMPREHENSIVE METABOLIC PANEL
ALBUMIN: 3.6 g/dL (ref 3.5–5.0)
ALK PHOS: 110 U/L (ref 38–126)
ALT: 18 U/L (ref 14–54)
ANION GAP: 9 (ref 5–15)
AST: 21 U/L (ref 15–41)
BILIRUBIN TOTAL: 0.5 mg/dL (ref 0.3–1.2)
BUN: 12 mg/dL (ref 6–20)
CHLORIDE: 106 mmol/L (ref 101–111)
CO2: 25 mmol/L (ref 22–32)
Calcium: 8.8 mg/dL — ABNORMAL LOW (ref 8.9–10.3)
Creatinine, Ser: 0.72 mg/dL (ref 0.44–1.00)
GLUCOSE: 97 mg/dL (ref 65–99)
POTASSIUM: 3.2 mmol/L — AB (ref 3.5–5.1)
SODIUM: 140 mmol/L (ref 135–145)
Total Protein: 6.9 g/dL (ref 6.5–8.1)

## 2015-04-05 LAB — CBC WITH DIFFERENTIAL/PLATELET
BASOS PCT: 0 % (ref 0–1)
Basophils Absolute: 0 10*3/uL (ref 0.0–0.1)
EOS ABS: 0.2 10*3/uL (ref 0.0–0.7)
EOS PCT: 3 % (ref 0–5)
HEMATOCRIT: 44.6 % (ref 36.0–46.0)
HEMOGLOBIN: 15.2 g/dL — AB (ref 12.0–15.0)
Lymphocytes Relative: 32 % (ref 12–46)
Lymphs Abs: 2.1 10*3/uL (ref 0.7–4.0)
MCH: 30.8 pg (ref 26.0–34.0)
MCHC: 34.1 g/dL (ref 30.0–36.0)
MCV: 90.5 fL (ref 78.0–100.0)
MONO ABS: 0.6 10*3/uL (ref 0.1–1.0)
MONOS PCT: 9 % (ref 3–12)
NEUTROS PCT: 56 % (ref 43–77)
Neutro Abs: 3.6 10*3/uL (ref 1.7–7.7)
PLATELETS: 282 10*3/uL (ref 150–400)
RBC: 4.93 MIL/uL (ref 3.87–5.11)
RDW: 13.2 % (ref 11.5–15.5)
WBC: 6.5 10*3/uL (ref 4.0–10.5)

## 2015-04-05 LAB — PROTIME-INR
INR: 0.98 (ref 0.00–1.49)
PROTHROMBIN TIME: 13.2 s (ref 11.6–15.2)

## 2015-04-05 LAB — I-STAT CG4 LACTIC ACID, ED: LACTIC ACID, VENOUS: 1.64 mmol/L (ref 0.5–2.0)

## 2015-04-05 MED ORDER — POTASSIUM CHLORIDE CRYS ER 20 MEQ PO TBCR
40.0000 meq | EXTENDED_RELEASE_TABLET | Freq: Once | ORAL | Status: AC
  Administered 2015-04-05: 40 meq via ORAL
  Filled 2015-04-05: qty 2

## 2015-04-05 MED ORDER — OXYCODONE-ACETAMINOPHEN 5-325 MG PO TABS: 1.0000 | ORAL_TABLET | ORAL | Status: DC | PRN

## 2015-04-05 NOTE — Discharge Instructions (Signed)
Take acetaminophen or ibuprofen for less severe pain. Return if you divelop abdominal pain, chest pain, vomiting, or difficulty breathing.  Snake Bite Snakes may be either venomous (containing poison) or nonvenomous (nonpoisonous). A nonvenomous snake bite will cause trauma or a wound to the skin and possibly the deeper tissues. A venomous snake will also cause a traumatic wound, but more importantly, it may have injected venom into the wound. Snake bite venom can be extremely serious and even deadly. One type of venom may cause major skin, tissue and muscle damage, and failure of normal blood clotting. This may cause extreme swelling and pain of the affected area. Another type of venom can affect the brain and nervous system and may cause death. The treatment for venomous snake bite may require the use of antivenom medicine. If you are unsure if your bite is from a venomous snake, you MUST seek immediate medical attention. YOU MIGHT NEED A TETANUS SHOT NOW IF:  You have no idea when you had the last one.  You have never had a tetanus shot before.  The bite broke your skin. If you need a tetanus shot, and you decide not to get one, there is a rare chance of getting tetanus. Sickness from tetanus can be serious. HOME CARE INSTRUCTIONS  A snake bit you and caused a skin wound. It may or may not have been venomous. If the snake was venomous, a small amount of venom may have been injected into your skin. 1. Keep the bite area clean and dry. 2. Keep the extremity elevated above the level of the heart for the next 48 hours. 3. Wash the bite area 3 times daily with soap and water or an antiseptic. Apply an adhesive or gauze bandage to the bite area. 4. If you develop blistering of any type at the site of the bite, protect the blisters from breaking. Do not attempt to open it. 5. If you were given a tetanus shot, your arm may get swollen, red and warm at the shot site. This is a common response to the  injection. SEEK IMMEDIATE MEDICAL CARE IF:  1. You develop symptoms of poisoning including increased pain, redness, swelling, blood blisters or purple spots in the bite area, nausea, vomiting, numbness, tingling, excessive sweating, breathing difficulty, blurred vision, feelings of lightheadedness, or feeling faint. If you develop symptoms of poisoning, you MUST seek immediate medical attention. 2. The bite becomes infected. Symptoms may include redness, swelling, pain, tenderness, pus, red streaks running from the wound, or an oral temperature above 102 F (38.9 C), not controlled by medicine. 3. Your condition or wound becomes worse. MAKE SURE YOU:  1. Understand these instructions. 2. Will watch your condition. 3. Will get help right away if you are not doing well or get worse. Document Released: 09/21/2000 Document Revised: 12/17/2011 Document Reviewed: 02/15/2010 [facility] Patient Information 2015 [place], [place]. This information is not intended to replace advice given to you by your health care provider. Make sure you discuss any questions you have with your health care provider.  Crutch Use Crutches are used to take weight off one of your legs or feet when you stand or walk. It is important to use crutches that fit properly. When fitted properly:  Each crutch should be 2-3 finger widths below the armpit.  Your weight should be supported by your hand, and not by resting the armpit on the crutch.  RISKS AND COMPLICATIONS Damage to the nerves that extend from your armpit to your  hand and arm. To prevent this from happening, make sure your crutches fit properly and do not put pressure on your armpit when using them. HOW TO USE YOUR CRUTCHES If you have been instructed to use partial weight bearing, apply (bear) the amount of weight as your health care provider suggests. Do not bear weight in an amount that causes pain to the area of injury. Walking 6. Step with the crutches. 7. Swing the  healthy leg slightly ahead of the crutches. Going Up Steps If there is no handrail: 4. Step up with the healthy leg. 5. Step up with the crutches and injured leg. 6. Continue in this way. If there is a handrail: 4. Hold both crutches in one hand. 5. Place your free hand on the handrail. 6. While putting your weight on your arms, lift your healthy leg to the step. 7. Bring the crutches and the injured leg up to that step. 8. Continue in this way. Going Down Steps Be very careful, as going down stairs with crutches is very challenging. If there is no handrail: 1. Step down with the injured leg and crutches. 2. Step down with the healthy leg. If there is a handrail: 1. Place your hand on the handrail. 2. Hold both crutches with your free hand. 3. Lower your injured leg and crutch to the step below you. Make sure to keep the crutch tips in the center of the step, never on the edge. 4. Lower your healthy leg to that step. 5. Continue in this way. Standing Up 1. Hold the injured leg forward. 2. Grab the armrest with one hand and the top of the crutches with the other hand. 3. Using these supports, pull yourself up to a standing position. Sitting Down 1. Hold the injured leg forward. 2. Grab the armrest with one hand and the top of the crutches with the other hand. 3. Lower yourself to a sitting position. SEEK MEDICAL CARE IF:  You still feel unsteady on your feet.  You develop new pain, for example in your armpits, back, shoulder, wrist, or hip.  You develop any numbness or tingling. SEEK IMMEDIATE MEDICAL CARE IF: You fall. Document Released: 09/21/2000 Document Revised: 09/29/2013 Document Reviewed: 06/01/2013 [facility] Patient Information 2015 [place], [place]. This information is not intended to replace advice given to you by your health care provider. Make sure you discuss any questions you have with your health care provider.  Acetaminophen; Oxycodone tablets What is this  medicine? ACETAMINOPHEN; OXYCODONE (a set a MEE noe fen; ox i KOE done) is a pain reliever. It is used to treat mild to moderate pain. This medicine may be used for other purposes; ask your health care provider or pharmacist if you have questions. COMMON BRAND NAME(S): Endocet, Magnacet, Narvox, Percocet, Perloxx, Primalev, Primlev, Roxicet, Xolox What should I tell my health care provider before I take this medicine? They need to know if you have any of these conditions: -brain tumor -Crohn's disease, inflammatory bowel disease, or ulcerative colitis -drug abuse or addiction -head injury -heart or circulation problems -if you often drink alcohol -kidney disease or problems going to the bathroom -liver disease -lung disease, asthma, or breathing problems -an unusual or allergic reaction to acetaminophen, oxycodone, other opioid analgesics, other medicines, foods, dyes, or preservatives -pregnant or trying to get pregnant -breast-feeding How should I use this medicine? Take this medicine by mouth with a full glass of water. Follow the directions on the prescription label. Take your medicine at regular intervals.  Do not take your medicine more often than directed. Talk to your pediatrician regarding the use of this medicine in children. Special care may be needed. Patients over 27 years old may have a stronger reaction and need a smaller dose. Overdosage: If you think you have taken too much of this medicine contact a poison control center or emergency room at once. NOTE: This medicine is only for you. Do not share this medicine with others. What if I miss a dose? If you miss a dose, take it as soon as you can. If it is almost time for your next dose, take only that dose. Do not take double or extra doses. What may interact with this medicine? -alcohol -antihistamines -barbiturates like amobarbital, butalbital, butabarbital, methohexital, pentobarbital, phenobarbital, thiopental, and  secobarbital -benztropine -drugs for bladder problems like solifenacin, trospium, oxybutynin, tolterodine, hyoscyamine, and methscopolamine -drugs for breathing problems like ipratropium and tiotropium -drugs for certain stomach or intestine problems like propantheline, homatropine methylbromide, glycopyrrolate, atropine, belladonna, and dicyclomine -general anesthetics like etomidate, ketamine, nitrous oxide, propofol, desflurane, enflurane, halothane, isoflurane, and sevoflurane -medicines for depression, anxiety, or psychotic disturbances -medicines for sleep -muscle relaxants -naltrexone -narcotic medicines (opiates) for pain -phenothiazines like perphenazine, thioridazine, chlorpromazine, mesoridazine, fluphenazine, prochlorperazine, promazine, and trifluoperazine -scopolamine -tramadol -trihexyphenidyl This list may not describe all possible interactions. Give your health care provider a list of all the medicines, herbs, non-prescription drugs, or dietary supplements you use. Also tell them if you smoke, drink alcohol, or use illegal drugs. Some items may interact with your medicine. What should I watch for while using this medicine? Tell your doctor or health care professional if your pain does not go away, if it gets worse, or if you have new or a different type of pain. You may develop tolerance to the medicine. Tolerance means that you will need a higher dose of the medication for pain relief. Tolerance is normal and is expected if you take this medicine for a long time. Do not suddenly stop taking your medicine because you may develop a severe reaction. Your body becomes used to the medicine. This does NOT mean you are addicted. Addiction is a behavior related to getting and using a drug for a non-medical reason. If you have pain, you have a medical reason to take pain medicine. Your doctor will tell you how much medicine to take. If your doctor wants you to stop the medicine, the dose  will be slowly lowered over time to avoid any side effects. You may get drowsy or dizzy. Do not drive, use machinery, or do anything that needs mental alertness until you know how this medicine affects you. Do not stand or sit up quickly, especially if you are an older patient. This reduces the risk of dizzy or fainting spells. Alcohol may interfere with the effect of this medicine. Avoid alcoholic drinks. There are different types of narcotic medicines (opiates) for pain. If you take more than one type at the same time, you may have more side effects. Give your health care provider a list of all medicines you use. Your doctor will tell you how much medicine to take. Do not take more medicine than directed. Call emergency for help if you have problems breathing. The medicine will cause constipation. Try to have a bowel movement at least every 2 to 3 days. If you do not have a bowel movement for 3 days, call your doctor or health care professional. Do not take Tylenol (acetaminophen) or medicines that have acetaminophen  with this medicine. Too much acetaminophen can be very dangerous. Many nonprescription medicines contain acetaminophen. Always read the labels carefully to avoid taking more acetaminophen. What side effects may I notice from receiving this medicine? Side effects that you should report to your doctor or health care professional as soon as possible: -allergic reactions like skin rash, itching or hives, swelling of the face, lips, or tongue -breathing difficulties, wheezing -confusion -light headedness or fainting spells -severe stomach pain -unusually weak or tired -yellowing of the skin or the whites of the eyes Side effects that usually do not require medical attention (report to your doctor or health care professional if they continue or are bothersome): -dizziness -drowsiness -nausea -vomiting This list may not describe all possible side effects. Call your doctor for medical  advice about side effects. You may report side effects to FDA at 1-800-FDA-1088. Where should I keep my medicine? Keep out of the reach of children. This medicine can be abused. Keep your medicine in a safe place to protect it from theft. Do not share this medicine with anyone. Selling or giving away this medicine is dangerous and against the law. Store at room temperature between 20 and 25 degrees C (68 and 77 degrees F). Keep container tightly closed. Protect from light. This medicine may cause accidental overdose and death if it is taken by other adults, children, or pets. Flush any unused medicine down the toilet to reduce the chance of harm. Do not use the medicine after the expiration date. NOTE: This sheet is a summary. It may not cover all possible information. If you have questions about this medicine, talk to your doctor, pharmacist, or health care provider.  2015, [employer]. (2013-05-18 13:17:35)

## 2015-04-05 NOTE — ED Provider Notes (Signed)
CSN: [id number]     Arrival date & time 04/04/15  2346 History   First MD Initiated Contact with Patient 04/05/15 0115     Chief Complaint  Patient presents with  . Snake Bite     (Consider location/radiation/quality/duration/timing/severity/associated sxs/prior Treatment) The history is provided by the patient.   64 year old female was bitten by a snake on her right heel. She was walking outside to 10 to some of her animals when the snake bit her. She is complaining of pain and swelling in the right foot and lower leg. She rates pain at 7/10. She denies abdominal pain, nausea, vomiting. She denies dyspnea or chest pain. She is up-to-date on tetanus immunizations. There is no treatment given at home. She did kill this make an identified it as a copperhead.   Past Medical History  Diagnosis Date  . Hyperlipidemia   . Hypertension   . Depression   . Thyroid disease    Past Surgical History  Procedure Laterality Date  . Appendectomy    . Colonoscopy    . Hernia repair     Family History  Problem Relation Age of Onset  . Hyperlipidemia Mother   . Heart disease Mother   . Cancer Father     colon   History  Substance Use Topics  . Smoking status: Never Smoker   . Smokeless tobacco: Not on file  . Alcohol Use: No   OB History    No data available     Review of Systems  All other systems reviewed and are negative.     Allergies  Erythromycin and Penicillins  Home Medications   Prior to Admission medications   Medication Sig Start Date End Date Taking? Authorizing Provider  calcium carbonate (OS-CAL) 600 MG TABS Take 600 mg by mouth 2 (two) times daily with a meal.     Yes Historical Provider, MD  Multiple Vitamins-Minerals (ICAPS MV PO) Take 1 capsule by mouth daily.    Yes Historical Provider, MD  Red Yeast Rice 600 MG CAPS Take 2 capsules by mouth daily.    Yes Historical Provider, MD  SYNTHROID 200 MCG tablet TAKE 1 TABLET DAILY 05/10/14  Yes Ricard Dillon, MD    BP 198/96 mmHg  Pulse 95  Temp(Src) 97.7 F (36.5 C) (Oral)  Resp 20  Wt 246 lb 8 oz (111.812 kg)  SpO2 97% Physical Exam  Nursing note and vitals reviewed.  64 year old female, resting comfortably and in no acute distress. Vital signs are  significant for hypertension. Oxygen saturation is 97%, which is normal. Head is normocephalic and atraumatic. PERRLA, EOMI. Oropharynx is clear. Neck is nontender and supple without adenopathy or JVD. Back is nontender and there is no CVA tenderness. Lungs are clear without rales, wheezes, or rhonchi. Chest is nontender. Heart has regular rate and rhythm without murmur. Abdomen is soft, flat, nontender without masses or hepatosplenomegaly and peristalsis is normoactive. Extremities: There are 2 small punctures on the medial aspect of the right heel. Right foot is mildly swollen with swelling extending to the distal right lower leg. There is some mild discoloration but no obvious ecchymosis. It is mildly warm to touch. No lymphangitic streaks are seen. Skin is warm and dry without rash. Neurologic: Mental status is normal, cranial nerves are intact, there are no motor or sensory deficits.  ED Course  Procedures (including critical care time) Labs Review Results for orders placed or performed during the hospital encounter of 04/05/15  Comprehensive metabolic  panel  Result Value Ref Range   Sodium 140 135 - 145 mmol/L   Potassium 3.2 (L) 3.5 - 5.1 mmol/L   Chloride 106 101 - 111 mmol/L   CO2 25 22 - 32 mmol/L   Glucose, Bld 97 65 - 99 mg/dL   BUN 12 6 - 20 mg/dL   Creatinine, Ser 0.72 0.44 - 1.00 mg/dL   Calcium 8.8 (L) 8.9 - 10.3 mg/dL   Total Protein 6.9 6.5 - 8.1 g/dL   Albumin 3.6 3.5 - 5.0 g/dL   AST 21 15 - 41 U/L   ALT 18 14 - 54 U/L   Alkaline Phosphatase 110 38 - 126 U/L   Total Bilirubin 0.5 0.3 - 1.2 mg/dL   GFR calc non Af Amer >60 >60 mL/min   GFR calc Af Amer >60 >60 mL/min   Anion gap 9 5 - 15  CBC with Differential   Result Value Ref Range   WBC 6.5 4.0 - 10.5 K/uL   RBC 4.93 3.87 - 5.11 MIL/uL   Hemoglobin 15.2 (H) 12.0 - 15.0 g/dL   HCT 44.6 36.0 - 46.0 %   MCV 90.5 78.0 - 100.0 fL   MCH 30.8 26.0 - 34.0 pg   MCHC 34.1 30.0 - 36.0 g/dL   RDW 13.2 11.5 - 15.5 %   Platelets 282 150 - 400 K/uL   Neutrophils Relative % 56 43 - 77 %   Neutro Abs 3.6 1.7 - 7.7 K/uL   Lymphocytes Relative 32 12 - 46 %   Lymphs Abs 2.1 0.7 - 4.0 K/uL   Monocytes Relative 9 3 - 12 %   Monocytes Absolute 0.6 0.1 - 1.0 K/uL   Eosinophils Relative 3 0 - 5 %   Eosinophils Absolute 0.2 0.0 - 0.7 K/uL   Basophils Relative 0 0 - 1 %   Basophils Absolute 0.0 0.0 - 0.1 K/uL  Protime-INR  Result Value Ref Range   Prothrombin Time 13.2 11.6 - 15.2 seconds   INR 0.98 0.00 - 1.49  I-Stat CG4 Lactic Acid, ED  Result Value Ref Range   Lactic Acid, Venous 1.64 0.5 - 2.0 mmol/L    MDM   Final diagnoses:  Poisonous snake bite, accidental or unintentional, initial encounter    snake bite of the right foot. I have looked at this make and it is indeed a copperhead and say a rather small snake. Currently, there are no signs of systemic and animation. Screening labs be obtained and she will be observed in the ED. At this point, no indication for antivenin treatment.  Screening labs are unremarkable. Incidental finding of mild hypokalemia is noted and she is given a dose of K-Dur. She is reevaluated and there is been no progression of her swelling or redness. No evidence of systemic envenomation. She is discharged on crutches and advised to follow-up with orthopedics in 2 days. Use over-the-counter analgesia as needed for pain. Prescription given for oxycodone-acetaminophen 4 was severe pain. Given appropriate return precautions.  Delora Fuel, MD 16/10/96 0454

## 2015-04-05 NOTE — ED Notes (Signed)
Pt. reports snake bite at right lower/medial ankle with pain and  Swelling , respirations unlabored/airway intact , denies nausea or vomitting / no fever or chills.

## 2015-04-15 ENCOUNTER — Encounter: Payer: Self-pay | Admitting: Adult Health

## 2015-04-15 ENCOUNTER — Other Ambulatory Visit: Payer: Self-pay | Admitting: Adult Health

## 2015-04-15 ENCOUNTER — Ambulatory Visit: Payer: BC Managed Care – PPO | Admitting: Family Medicine

## 2015-04-15 ENCOUNTER — Ambulatory Visit (INDEPENDENT_AMBULATORY_CARE_PROVIDER_SITE_OTHER): Payer: BLUE CROSS/BLUE SHIELD | Admitting: Adult Health

## 2015-04-15 VITALS — BP 160/100 | Temp 98.2°F | Ht 65.0 in | Wt 247.0 lb

## 2015-04-15 DIAGNOSIS — I10 Essential (primary) hypertension: Secondary | ICD-10-CM

## 2015-04-15 DIAGNOSIS — Z09 Encounter for follow-up examination after completed treatment for conditions other than malignant neoplasm: Secondary | ICD-10-CM

## 2015-04-15 LAB — BASIC METABOLIC PANEL
BUN: 11 mg/dL (ref 6–23)
CO2: 32 mEq/L (ref 19–32)
Calcium: 9.7 mg/dL (ref 8.4–10.5)
Chloride: 105 mEq/L (ref 96–112)
Creatinine, Ser: 0.66 mg/dL (ref 0.40–1.20)
GFR: 96.03 mL/min (ref >60.00)
Glucose, Bld: 93 mg/dL (ref 70–99)
Potassium: 4.3 mEq/L (ref 3.5–5.1)
Sodium: 143 mEq/L (ref 135–145)

## 2015-04-15 MED ORDER — HYDROCHLOROTHIAZIDE 25 MG PO TABS: 25.0000 mg | ORAL_TABLET | Freq: Every day | ORAL | Status: DC

## 2015-04-15 NOTE — Progress Notes (Signed)
Subjective:    Patient ID: [name], female    DOB: July 18, 1951, 64 y.o.   MRN: [id number]  HPI  Shernell presents to the office today following discharge from [facility] s/p [place] bite to right ankle. She did not receive anti venom in the hospital. She endorses that her pain is getting better and that the bite sight is just itching currently. The swelling has also improved.   She continues to be hypertensive. In the ER she was 409 systolic and today in the office she is 160/100. She has in the past taken HTN medications, but she lost weight and was taken off them.    Review of Systems  Musculoskeletal: Positive for arthralgias.  Neurological: Negative.   Hematological: Negative.   All other systems reviewed and are negative.  Past Medical History  Diagnosis Date  . Hyperlipidemia   . Hypertension   . Depression   . Thyroid disease     History   Social History  . Marital Status: Married    Spouse Name: N/A  . Number of Children: N/A  . Years of Education: N/A   Occupational History  . Not on file.   Social History Main Topics  . Smoking status: Never Smoker   . Smokeless tobacco: Not on file  . Alcohol Use: No  . Drug Use: No  . Sexual Activity: Not on file   Other Topics Concern  . Not on file   Social History Narrative    Past Surgical History  Procedure Laterality Date  . Appendectomy    . Colonoscopy    . Hernia repair      Family History  Problem Relation Age of Onset  . Hyperlipidemia Mother   . Heart disease Mother   . Cancer Father     colon    Allergies  Allergen Reactions  . Erythromycin     REACTION: Rash  . Penicillins     REACTION: Hives    Current Outpatient Prescriptions on File Prior to Visit  Medication Sig Dispense Refill  . calcium carbonate (OS-CAL) 600 MG TABS Take 600 mg by mouth 2 (two) times daily with a meal.      . Multiple Vitamins-Minerals (ICAPS MV PO) Take 1 capsule by mouth daily.     . Red Yeast Rice 600  MG CAPS Take 2 capsules by mouth daily.     Marland Kitchen SYNTHROID 200 MCG tablet TAKE 1 TABLET DAILY 90 tablet 0   No current facility-administered medications on file prior to visit.    BP 160/100 mmHg  Temp(Src) 98.2 F (36.8 C) (Oral)  Ht 5\' 5"  (1.651 m)  Wt 247 lb (112.038 kg)  BMI 41.10 kg/m2       Objective:   Physical Exam  Constitutional: She is oriented to person, place, and time. She appears well-nourished. No distress.  Eyes: Left eye exhibits no discharge.  Cardiovascular: Normal rate, regular rhythm, normal heart sounds and intact distal pulses.  Exam reveals no gallop and no friction rub.   No murmur heard. Pulmonary/Chest: Effort normal and breath sounds normal. No respiratory distress. She has no wheezes. She has no rales. She exhibits no tenderness.  Musculoskeletal: Normal range of motion. She exhibits tenderness. She exhibits no edema.  Tenderness with palpation around bite mark.   Neurological: She is alert and oriented to person, place, and time. She has normal reflexes.  Skin: Skin is warm and dry. She is not diaphoretic. There is erythema.  Very slight  erythremia around bite mark. Trace swelling. No necrosis.   Psychiatric: She has a normal mood and affect. Her behavior is normal. Judgment and thought content normal.  Nursing note and vitals reviewed.      Assessment & Plan:  1. Hospital discharge follow-up - Continue to monitor for S&S of infection - Continue to elevate right leg and apply ice to area - Educated on signs and symptoms of necrosis.   2. Essential hypertension - She was hypokalemic in hospital  - Basic metabolic panel - Consider 25 mg HCTZ and 20 mg Kdur - Follow up in 1 month for repeat

## 2015-04-15 NOTE — Patient Instructions (Addendum)
It was great meeting you today. I will follow up with you this afternoon with the results of your labs.   Please let me know if there is anything I can do.

## 2015-04-15 NOTE — Progress Notes (Signed)
Pre visit review using our clinic review tool, if applicable. No additional management support is needed unless otherwise documented below in the visit note. 

## 2015-05-23 ENCOUNTER — Encounter: Payer: Self-pay | Admitting: Gastroenterology

## 2015-05-23 ENCOUNTER — Encounter: Payer: Self-pay | Admitting: Adult Health

## 2015-05-23 ENCOUNTER — Other Ambulatory Visit: Payer: Self-pay | Admitting: Adult Health

## 2015-05-23 ENCOUNTER — Ambulatory Visit (INDEPENDENT_AMBULATORY_CARE_PROVIDER_SITE_OTHER): Payer: BLUE CROSS/BLUE SHIELD | Admitting: Adult Health

## 2015-05-23 VITALS — BP 160/90 | Temp 98.3°F | Ht 64.25 in | Wt 244.4 lb

## 2015-05-23 DIAGNOSIS — Z7689 Persons encountering health services in other specified circumstances: Secondary | ICD-10-CM

## 2015-05-23 DIAGNOSIS — E039 Hypothyroidism, unspecified: Secondary | ICD-10-CM | POA: Diagnosis not present

## 2015-05-23 DIAGNOSIS — I10 Essential (primary) hypertension: Secondary | ICD-10-CM | POA: Diagnosis not present

## 2015-05-23 LAB — BASIC METABOLIC PANEL
BUN: 16 mg/dL (ref 6–23)
CHLORIDE: 101 meq/L (ref 96–112)
CO2: 33 meq/L — AB (ref 19–32)
Calcium: 9.9 mg/dL (ref 8.4–10.5)
Creatinine, Ser: 0.79 mg/dL (ref 0.40–1.20)
GFR: 78.01 mL/min (ref >60.00)
GLUCOSE: 108 mg/dL — AB (ref 70–99)
POTASSIUM: 4.2 meq/L (ref 3.5–5.1)
SODIUM: 143 meq/L (ref 135–145)

## 2015-05-23 LAB — TSH: TSH: 0.08 u[IU]/mL — ABNORMAL LOW (ref 0.35–4.50)

## 2015-05-23 LAB — T3, FREE: T3, Free: 3.5 pg/mL (ref 2.3–4.2)

## 2015-05-23 LAB — T4, FREE: Free T4: 1.72 ng/dL — ABNORMAL HIGH (ref 0.60–1.60)

## 2015-05-23 MED ORDER — LEVOTHYROXINE SODIUM 175 MCG PO TABS: 175.0000 ug | ORAL_TABLET | Freq: Every day | ORAL | Status: DC

## 2015-05-23 NOTE — Progress Notes (Signed)
Pre visit review using our clinic review tool, if applicable. No additional management support is needed unless otherwise documented below in the visit note. 

## 2015-05-23 NOTE — Patient Instructions (Signed)
It was great seeing you again!   Please follow up with me in 4 weeks for a physical.   I will call you regarding your blood work.   If you need anything please let me know.

## 2015-05-23 NOTE — Progress Notes (Signed)
HPI:  [name] is here to establish care. She is a very pleasant caucasian female with  has a past medical history of Hyperlipidemia; Hypertension; Depression; and Thyroid disease.  Last PCP and physical:08/12/2013 Immunizations:UTD Diet:Does not follow any diet.  Exercise:Is hard to exercise due to arthritic knees Colonoscopy:" Six years ago" Pap Smear: only abnormal was 20 + years ago.  Mammogram:40 years ago.   Has the following chronic problems that require follow up and concerns today:   Hypothyroidism  -Reviewing labs her TSH has been low for the last five years. She endorses not handling heat well. She feels as though she is losing more hair than normal. No other symptoms of hyperthyroidism.    HTN - Was started on HCTZ during the last visit. It has helped with the swelling in the feet but has not helped with blood pressure. She denies any CP, SOB, blurred vision, headaches or dizziness.   ROS negative for unless reported above: fevers, chills,feeling poorly, unintentional weight loss, hearing or vision loss, chest pain, palpitations, leg claudication, struggling to breath,Not feeling congested in the chest, no orthopenia, no cough,no wheezing, normal appetite, no soft tissue swelling, no hemoptysis, melena, hematochezia, hematuria, falls, loc, si, or thoughts of self harm.    Past Medical History  Diagnosis Date  . Hyperlipidemia   . Hypertension   . Depression   . Thyroid disease     Past Surgical History  Procedure Laterality Date  . Appendectomy    . Colonoscopy    . Hernia repair      Family History  Problem Relation Age of Onset  . Hyperlipidemia Mother   . Heart disease Mother   . Cancer Father     colon    Social History   Social History  . Marital Status: Married    Spouse Name: N/A  . Number of Children: N/A  . Years of Education: N/A   Social History Main Topics  . Smoking status: Never Smoker   . Smokeless tobacco: Not on file   . Alcohol Use: No  . Drug Use: No  . Sexual Activity: Not on file   Other Topics Concern  . Not on file   Social History Narrative     Current outpatient prescriptions:  .  calcium carbonate (OS-CAL) 600 MG TABS, Take 600 mg by mouth 2 (two) times daily with a meal.  , Disp: , Rfl:  .  hydrochlorothiazide (HYDRODIURIL) 25 MG tablet, Take 1 tablet (25 mg total) by mouth daily., Disp: 90 tablet, Rfl: 3 .  Multiple Vitamins-Minerals (ICAPS MV PO), Take 1 capsule by mouth daily. , Disp: , Rfl:  .  Red Yeast Rice 600 MG CAPS, Take 2 capsules by mouth daily. , Disp: , Rfl:  .  SYNTHROID 200 MCG tablet, TAKE 1 TABLET DAILY, Disp: 90 tablet, Rfl: 0  EXAM:  There were no vitals filed for this visit.  There is no weight on file to calculate BMI.  GENERAL: vitals reviewed and listed above, alert, oriented, appears well hydrated and in no acute distress. Slight  obese  HEENT: atraumatic, conjunttiva clear, no obvious abnormalities on inspection of external nose and ears  NECK: Neck is soft and supple without masses, no adenopathy or thyromegaly, trachea midline, no JVD. Normal range of motion.   LUNGS: clear to auscultation bilaterally, no wheezes, rales or rhonchi, good air movement  CV: Regular rate and rhythm, normal S1/S2, no audible murmurs, gallops, or rubs. No carotid bruit and  no peripheral edema.   MS: moves all extremities without noticeable abnormality. No edema noted  Abd: soft/nontender/nondistended/normal bowel sounds   Skin: warm and dry, no rash   Extremities: No clubbing, cyanosis, or edema. Capillary refill is WNL. Pulses intact bilaterally in upper and lower extremities.   Neuro: CN II-XII intact, sensation and reflexes normal throughout, 5/5 muscle strength in bilateral upper and lower extremities. Normal finger to nose. Normal rapid alternating movements.   PSYCH: pleasant and cooperative, no obvious depression or anxiety  ASSESSMENT AND PLAN:  1.  Encounter to establish care - Ambulatory referral to Gastroenterology - MM DIGITAL SCREENING BILATERAL; Future  2. Hypothyroidism, unspecified hypothyroidism type - Basic metabolic panel - TSH - Consider decreasing Synthroid - TSH levels have been decreased for the last 5 years.   3. Essential hypertension - May be due to her hyperthyroidism.  - Basic metabolic panel     -We reviewed the PMH, PSH, FH, SH, Meds and Allergies. -We provided refills for any medications we will prescribe as needed. -We addressed current concerns per orders and patient instructions. -We have asked for records for pertinent exams, studies, vaccines and notes from previous providers. -We have advised patient to follow up per instructions below.   -Patient advised to return or notify a provider immediately if symptoms worsen or persist or new concerns arise.    Dorothyann Peng, AGNP

## 2015-05-25 ENCOUNTER — Other Ambulatory Visit: Payer: Self-pay

## 2015-05-25 DIAGNOSIS — I10 Essential (primary) hypertension: Secondary | ICD-10-CM

## 2015-05-25 MED ORDER — HYDROCHLOROTHIAZIDE 25 MG PO TABS: 25.0000 mg | ORAL_TABLET | Freq: Every day | ORAL | Status: DC

## 2015-05-26 ENCOUNTER — Encounter: Payer: Self-pay | Admitting: Gastroenterology

## 2015-07-01 ENCOUNTER — Other Ambulatory Visit (INDEPENDENT_AMBULATORY_CARE_PROVIDER_SITE_OTHER): Payer: BLUE CROSS/BLUE SHIELD

## 2015-07-01 DIAGNOSIS — Z0000 Encounter for general adult medical examination without abnormal findings: Secondary | ICD-10-CM | POA: Diagnosis not present

## 2015-07-01 LAB — LIPID PANEL
CHOLESTEROL: 165 mg/dL (ref 0–200)
HDL: 43.8 mg/dL (ref >39.00)
LDL Cholesterol: 91 mg/dL (ref 0–99)
NonHDL: 121.59
Total CHOL/HDL Ratio: 4
Triglycerides: 153 mg/dL — ABNORMAL HIGH (ref 0.0–149.0)
VLDL: 30.6 mg/dL (ref 0.0–40.0)

## 2015-07-01 LAB — HEPATIC FUNCTION PANEL
ALT: 28 U/L (ref 0–35)
AST: 19 U/L (ref 0–37)
Albumin: 3.9 g/dL (ref 3.5–5.2)
Alkaline Phosphatase: 103 U/L (ref 39–117)
BILIRUBIN DIRECT: 0.1 mg/dL (ref 0.0–0.3)
BILIRUBIN TOTAL: 0.7 mg/dL (ref 0.2–1.2)
Total Protein: 7.3 g/dL (ref 6.0–8.3)

## 2015-07-01 LAB — POCT URINALYSIS DIPSTICK
Bilirubin, UA: NEGATIVE
Blood, UA: NEGATIVE
Glucose, UA: NEGATIVE
KETONES UA: NEGATIVE
Nitrite, UA: NEGATIVE
PROTEIN UA: NEGATIVE
Spec Grav, UA: 1.02
UROBILINOGEN UA: 0.2
pH, UA: 7

## 2015-07-01 LAB — CBC WITH DIFFERENTIAL/PLATELET
Basophils Absolute: 0 10*3/uL (ref 0.0–0.1)
Basophils Relative: 0.3 % (ref 0.0–3.0)
EOS ABS: 0.1 10*3/uL (ref 0.0–0.7)
EOS PCT: 2.5 % (ref 0.0–5.0)
HCT: 45.9 % (ref 36.0–46.0)
HEMOGLOBIN: 15.2 g/dL — AB (ref 12.0–15.0)
LYMPHS ABS: 1.6 10*3/uL (ref 0.7–4.0)
LYMPHS PCT: 27.5 % (ref 12.0–46.0)
MCHC: 33.2 g/dL (ref 30.0–36.0)
MCV: 91 fl (ref 78.0–100.0)
MONOS PCT: 10.3 % (ref 3.0–12.0)
Monocytes Absolute: 0.6 10*3/uL (ref 0.1–1.0)
NEUTROS ABS: 3.5 10*3/uL (ref 1.4–7.7)
Neutrophils Relative %: 59.4 % (ref 43.0–77.0)
Platelets: 322 10*3/uL (ref 150.0–400.0)
RBC: 5.04 Mil/uL (ref 3.87–5.11)
RDW: 13.3 % (ref 11.5–15.5)
WBC: 5.8 10*3/uL (ref 4.0–10.5)

## 2015-07-01 LAB — BASIC METABOLIC PANEL
BUN: 12 mg/dL (ref 6–23)
CO2: 33 meq/L — AB (ref 19–32)
Calcium: 9.5 mg/dL (ref 8.4–10.5)
Chloride: 103 mEq/L (ref 96–112)
Creatinine, Ser: 0.7 mg/dL (ref 0.40–1.20)
GFR: 89.66 mL/min (ref >60.00)
GLUCOSE: 101 mg/dL — AB (ref 70–99)
POTASSIUM: 3.9 meq/L (ref 3.5–5.1)
SODIUM: 144 meq/L (ref 135–145)

## 2015-07-01 LAB — TSH: TSH: 0.06 u[IU]/mL — ABNORMAL LOW (ref 0.35–4.50)

## 2015-07-04 ENCOUNTER — Other Ambulatory Visit: Payer: Self-pay | Admitting: Adult Health

## 2015-07-04 DIAGNOSIS — E039 Hypothyroidism, unspecified: Secondary | ICD-10-CM

## 2015-07-04 MED ORDER — LEVOTHYROXINE SODIUM 150 MCG PO TABS: 150.0000 ug | ORAL_TABLET | Freq: Every day | ORAL | Status: DC

## 2015-07-06 ENCOUNTER — Ambulatory Visit (AMBULATORY_SURGERY_CENTER): Payer: Self-pay | Admitting: *Deleted

## 2015-07-06 VITALS — Ht 64.0 in | Wt 241.2 lb

## 2015-07-06 DIAGNOSIS — Z86010 Personal history of colonic polyps: Secondary | ICD-10-CM

## 2015-07-06 DIAGNOSIS — Z800 Family history of malignant neoplasm of digestive organs: Secondary | ICD-10-CM

## 2015-07-06 MED ORDER — NA SULFATE-K SULFATE-MG SULF 17.5-3.13-1.6 GM/177ML PO SOLN: 1.0000 | Freq: Once | ORAL | Status: DC

## 2015-07-06 NOTE — Progress Notes (Signed)
Denies allergies to eggs or soy products. Denies complications with sedation or anesthesia. Denies O2 use. Denies use of diet or weight loss medications.  Emmi instructions given for colonoscopy.  

## 2015-07-07 ENCOUNTER — Telehealth: Payer: Self-pay | Admitting: Adult Health

## 2015-07-07 NOTE — Telephone Encounter (Signed)
[facility] [place], [place]   Requesting refill of levothyroxine (SYNTHROID, LEVOTHROID) 175 MCG tablet

## 2015-07-08 ENCOUNTER — Encounter: Payer: Self-pay | Admitting: Adult Health

## 2015-07-08 ENCOUNTER — Ambulatory Visit (INDEPENDENT_AMBULATORY_CARE_PROVIDER_SITE_OTHER): Payer: BLUE CROSS/BLUE SHIELD | Admitting: Adult Health

## 2015-07-08 VITALS — BP 140/100 | HR 96 | Temp 98.4°F | Ht 64.57 in | Wt 239.5 lb

## 2015-07-08 DIAGNOSIS — I10 Essential (primary) hypertension: Secondary | ICD-10-CM | POA: Diagnosis not present

## 2015-07-08 DIAGNOSIS — Z0000 Encounter for general adult medical examination without abnormal findings: Secondary | ICD-10-CM

## 2015-07-08 DIAGNOSIS — Z23 Encounter for immunization: Secondary | ICD-10-CM

## 2015-07-08 MED ORDER — LISINOPRIL 5 MG PO TABS: 5.0000 mg | ORAL_TABLET | Freq: Every day | ORAL | Status: DC

## 2015-07-08 MED ORDER — SYNTHROID 150 MCG PO TABS: 150.0000 ug | ORAL_TABLET | Freq: Every day | ORAL | Status: DC

## 2015-07-08 NOTE — Patient Instructions (Signed)
It was great seeing you today!  Start taking the 5 mg lisinopril. Send me a mychart message in the next 2 weeks with your blood pressure readings.   Follow up in 6 months to have your blood work redone for your thyroid.   Follow up with me in one year for your next physical or sooner if needed.

## 2015-07-08 NOTE — Progress Notes (Signed)
Subjective:    Patient ID: [name], female    DOB: 1951-09-29, 64 y.o.   MRN: [id number]  HPI  Patient presents for yearly preventative medicine examination.  All immunizations and health maintenance protocols were reviewed with the patient and needed orders were placed.  Appropriate screening laboratory values were reviewed with the patient including screening of hyperlipidemia, renal function and hepatic function.  Medication reconciliation,  past medical history, social history, problem list and allergies were reviewed in detail with the patient  Goals were established with regard to weight loss, exercise, and  diet in compliance with medications  End of life planning was discussed- she has a power of attorney. She does not have a living will, but her wishes are known to the family.  She has a colonoscopy in two weeks. She does not want to do mammograms anymore. She does self breast exam today. Has not had a Pap in many years, and does not want one.   Review of Systems  Constitutional: Negative.   HENT: Negative.   Eyes: Negative.   Respiratory: Negative.   Cardiovascular: Negative.   Gastrointestinal: Negative.   Endocrine: Negative.   Genitourinary: Negative.   Musculoskeletal: Negative.   Skin: Negative.   Allergic/Immunologic: Negative.   Neurological: Negative.   Hematological: Negative.   Psychiatric/Behavioral: Negative.   All other systems reviewed and are negative.  Past Medical History  Diagnosis Date  . Hyperlipidemia   . Hypertension   . Depression   . Thyroid disease     Social History   Social History  . Marital Status: Married    Spouse Name: N/A  . Number of Children: N/A  . Years of Education: N/A   Occupational History  . Not on file.   Social History Main Topics  . Smoking status: Never Smoker   . Smokeless tobacco: Never Used  . Alcohol Use: No  . Drug Use: No  . Sexual Activity: Not on file   Other Topics Concern  . Not  on file   Social History Narrative    Past Surgical History  Procedure Laterality Date  . Appendectomy    . Colonoscopy    . Hernia repair      Family History  Problem Relation Age of Onset  . Hyperlipidemia Mother   . Heart disease Mother   . Macular degeneration Mother   . Dementia Mother   . Cancer Father     colon  . Colon cancer Father   . Colon cancer Maternal Grandfather     Allergies  Allergen Reactions  . Erythromycin     REACTION: Rash  . Penicillins     REACTION: Hives  . Thyroid Hormones Hives    Armor Thyroid    Current Outpatient Prescriptions on File Prior to Visit  Medication Sig Dispense Refill  . calcium carbonate (OS-CAL) 600 MG TABS Take 600 mg by mouth 2 (two) times daily with a meal.      . hydrochlorothiazide (HYDRODIURIL) 25 MG tablet Take 1 tablet (25 mg total) by mouth daily. 90 tablet 3  . Multiple Vitamins-Minerals (ICAPS MV PO) Take 1 capsule by mouth daily.     . Na Sulfate-K Sulfate-Mg Sulf SOLN Take 1 kit by mouth once. suprep as directed. No substitutions. 354 mL 0  . Red Yeast Rice 600 MG CAPS Take 2 capsules by mouth daily.     Marland Kitchen SYNTHROID 150 MCG tablet Take 1 tablet (150 mcg total) by mouth daily. 30 tablet  0   No current facility-administered medications on file prior to visit.    BP 140/100 mmHg  Pulse 96  Temp(Src) 98.4 F (36.9 C) (Oral)  Ht 5' 4.57" (1.64 m)  Wt 239 lb 8 oz (108.636 kg)  BMI 40.39 kg/m2  SpO2 97%       Objective:   Physical Exam  Constitutional: She is oriented to person, place, and time. She appears well-developed and well-nourished. No distress.  HENT:  Head: Normocephalic and atraumatic.  Right Ear: External ear normal.  Left Ear: External ear normal.  Nose: Nose normal.  Mouth/Throat: Oropharynx is clear and moist. No oropharyngeal exudate.  Eyes: Conjunctivae and EOM are normal. Pupils are equal, round, and reactive to light. Right eye exhibits no discharge. Left eye exhibits no  discharge.  Neck: Normal range of motion. Neck supple. No tracheal deviation present. No thyromegaly present.  Cardiovascular: Normal rate, regular rhythm, normal heart sounds and intact distal pulses.  Exam reveals no gallop and no friction rub.   No murmur heard. No carotid bruit  Pulmonary/Chest: Effort normal and breath sounds normal. No respiratory distress. She has no wheezes. She has no rales. She exhibits no tenderness.  Abdominal: Soft. Bowel sounds are normal. She exhibits no distension and no mass. There is no tenderness. There is no rebound and no guarding.  Genitourinary:  Breast Exam: No abnormal masses, lumps, dimpling or discharge.   Musculoskeletal: Normal range of motion. She exhibits no edema or tenderness.  Lymphadenopathy:    She has no cervical adenopathy.  Neurological: She is alert and oriented to person, place, and time. She has normal reflexes. No cranial nerve deficit. She exhibits normal muscle tone. Coordination normal.  Skin: Skin is warm and dry. No rash noted. She is not diaphoretic. No erythema. No pallor.  Psychiatric: She has a normal mood and affect. Her behavior is normal. Judgment and thought content normal.  Nursing note and vitals reviewed.      Assessment & Plan:  1. Routine general medical examination at a health care facility - Follow up in one year for CPE - Follow up sooner if needed  - Work on diet and exercise 2. Essential hypertension - lisinopril (PRINIVIL,ZESTRIL) 5 MG tablet; Take 1 tablet (5 mg total) by mouth daily.  Dispense: 30 tablet; Refill: 3 - EKG 12-Lead- NSR, Rate 80 - Follow up in 2 weeks with blood pressure readings.

## 2015-07-08 NOTE — Addendum Note (Signed)
Addended by: Ailene Rud E on: 07/08/2015 09:31 AM   Modules accepted: Orders

## 2015-07-08 NOTE — Progress Notes (Signed)
Pre visit review using our clinic review tool, if applicable. No additional management support is needed unless otherwise documented below in the visit note. 

## 2015-07-08 NOTE — Telephone Encounter (Signed)
Pt's dose of levothyroxine has been changed to 150 mcg. Per Tommi Rumps we would like to see what the labs look like when pt returns in 6 weeks.

## 2015-07-20 ENCOUNTER — Encounter: Payer: Self-pay | Admitting: Gastroenterology

## 2015-07-20 ENCOUNTER — Ambulatory Visit (AMBULATORY_SURGERY_CENTER): Payer: BLUE CROSS/BLUE SHIELD | Admitting: Gastroenterology

## 2015-07-20 VITALS — BP 131/91 | HR 69 | Temp 96.6°F | Resp 18 | Ht 64.0 in | Wt 241.0 lb

## 2015-07-20 DIAGNOSIS — Z800 Family history of malignant neoplasm of digestive organs: Secondary | ICD-10-CM | POA: Diagnosis not present

## 2015-07-20 DIAGNOSIS — Z86010 Personal history of colonic polyps: Secondary | ICD-10-CM | POA: Diagnosis not present

## 2015-07-20 DIAGNOSIS — D123 Benign neoplasm of transverse colon: Secondary | ICD-10-CM | POA: Diagnosis not present

## 2015-07-20 MED ORDER — SODIUM CHLORIDE 0.9 % IV SOLN: 500.0000 mL | INTRAVENOUS | Status: DC

## 2015-07-20 NOTE — Patient Instructions (Addendum)
YOU HAD AN ENDOSCOPIC PROCEDURE TODAY AT [facility]:   Refer to the procedure report that was given to you for any specific questions about what was found during the examination.  If the procedure report does not answer your questions, please call your gastroenterologist to clarify.  If you requested that your care partner not be given the details of your procedure findings, then the procedure report has been included in a sealed envelope for you to review at your convenience later.  YOU SHOULD EXPECT: Some feelings of bloating in the abdomen. Passage of more gas than usual.  Walking can help get rid of the air that was put into your GI tract during the procedure and reduce the bloating. If you had a lower endoscopy (such as a colonoscopy or flexible sigmoidoscopy) you may notice spotting of blood in your stool or on the toilet paper. If you underwent a bowel prep for your procedure, you may not have a normal bowel movement for a few days.  Please Note:  You might notice some irritation and congestion in your nose or some drainage.  This is from the oxygen used during your procedure.  There is no need for concern and it should clear up in a day or so.  SYMPTOMS TO REPORT IMMEDIATELY:   Following lower endoscopy (colonoscopy or flexible sigmoidoscopy):  Excessive amounts of blood in the stool  Significant tenderness or worsening of abdominal pains  Swelling of the abdomen that is new, acute  Fever of 100F or higher  For urgent or emergent issues, a gastroenterologist can be reached at any hour by calling [phone].   DIET: Your first meal following the procedure should be a small meal and then it is ok to progress to your normal diet. Heavy or fried foods are harder to digest and may make you feel nauseous or bloated.  Likewise, meals heavy in dairy and vegetables can increase bloating.  Drink plenty of fluids but you should avoid alcoholic beverages for 24  hours.  ACTIVITY:  You should plan to take it easy for the rest of today and you should NOT DRIVE or use heavy machinery until tomorrow (because of the sedation medicines used during the test).    FOLLOW UP: Our staff will call the number listed on your records the next business day following your procedure to check on you and address any questions or concerns that you may have regarding the information given to you following your procedure. If we do not reach you, we will leave a message.  However, if you are feeling well and you are not experiencing any problems, there is no need to return our call.  We will assume that you have returned to your regular daily activities without incident.  If any biopsies were taken you will be contacted by phone or by letter within the next 1-3 weeks.  Please call us at [phone] if you have not heard about the biopsies in 3 weeks.    SIGNATURES/CONFIDENTIALITY: You and/or your care partner have signed paperwork which will be entered into your electronic medical record.  These signatures attest to the fact that that the information above on your After Visit Summary has been reviewed and is understood.  Full responsibility of the confidentiality of this discharge information lies with you and/or your care-partner.  Hold aspirin and all other NSAID'S for 2 weeks. Next colonoscopy in 5 years. Please review polyp, diverticulosis, and high fiber diet handouts provided.

## 2015-07-20 NOTE — Progress Notes (Signed)
Transferred to recovery room. A/O x3, pleased with MAC.  VSS.  Report to Wendy, RN. 

## 2015-07-20 NOTE — Op Note (Signed)
[facility]  [address] [place], [id number]   COLONOSCOPY PROCEDURE REPORT  PATIENT: [name], [name]  MR#: [id number] BIRTHDATE: 10-30-1950 , 64  yrs. old GENDER: female ENDOSCOPIST: Ladene Artist, MD, [facility] REFERRED BY:  Dorothyann Peng, NP  [R PROCEDURE DATE:  07/20/2015 PROCEDURE:   Colonoscopy, surveillance , Colonoscopy with biopsy, and Colonoscopy with snare polypectomy First Screening Colonoscopy - Avg.  risk and is 50 yrs.  old or older - No.  Prior Negative Screening - Now for repeat screening. N/A  History of Adenoma - Now for follow-up colonoscopy & has been > or = to 3 yrs.  Yes hx of adenoma.  Has been 3 or more years since last colonoscopy.  Polyps removed today? Yes ASA CLASS:   Class II INDICATIONS:Surveillance due to prior colonic neoplasia, PH Colon Adenoma, and FH Colon or Rectal Adenocarcinoma. MEDICATIONS: Monitored anesthesia care and Propofol 250 mg IV DESCRIPTION OF PROCEDURE:   After the risks benefits and alternatives of the procedure were thoroughly explained, informed consent was obtained.  The digital rectal exam revealed no abnormalities of the rectum.   The [id number]  endoscope was introduced through the anus and advanced to the cecum, which was identified by both the appendix and ileocecal valve. No adverse events experienced.   The quality of the prep was excellent. (Suprep was used)  The instrument was then slowly withdrawn as the colon was fully examined. Estimated blood loss is zero unless otherwise noted in this procedure report.    COLON FINDINGS: Two sessile polyps measuring 6 mm and 4 mm  in size were found in the transverse colon.  Polypectomies were performed with a cold snare and with cold forceps  respectively.  The resection was complete, the polyp tissue was completely retrieved and sent to histology. There was mild diverticulosis noted in the descending colon and sigmoid colon.   The examination was  otherwise normal.  Retroflexed views revealed no abnormalities. The time to cecum = 2.1 Withdrawal time = 9.6   The scope was withdrawn and the procedure completed. COMPLICATIONS: There were no immediate complications.  ENDOSCOPIC IMPRESSION: 1.   Two sessile polyps in the transverse colon; polypectomies performed with a cold snare and with cold forceps 2.   Mild diverticulosis in the descending colon and sigmoid colon  RECOMMENDATIONS: 1.  Hold Aspirin and all other NSAIDS for 2 weeks. 2.  Await pathology results 3.  High fiber diet with liberal fluid intake. 4.  Repeat Colonoscopy in 5 years.  eSigned:  Ladene Artist, MD, [facility] 07/20/2015 2:52 PM

## 2015-07-20 NOTE — Progress Notes (Signed)
Called to room to assist during endoscopic procedure.  Patient ID and intended procedure confirmed with present staff. Received instructions for my participation in the procedure from the performing physician.  

## 2015-07-21 ENCOUNTER — Telehealth: Payer: Self-pay | Admitting: *Deleted

## 2015-07-21 NOTE — Telephone Encounter (Signed)
  Follow up Call-  Call back number 07/20/2015  Post procedure Call Back phone  # [phone]  Permission to leave phone message Yes     Patient questions:  Do you have a fever, pain , or abdominal swelling? No. Pain Score  0 *  Have you tolerated food without any problems? Yes.    Have you been able to return to your normal activities? Yes.    Do you have any questions about your discharge instructions: Diet   No. Medications  No. Follow up visit  No.  Do you have questions or concerns about your Care? No.  Actions: * If pain score is 4 or above: No action needed, pain <4.

## 2015-07-25 ENCOUNTER — Encounter: Payer: Self-pay | Admitting: Gastroenterology

## 2015-08-15 ENCOUNTER — Other Ambulatory Visit: Payer: Self-pay | Admitting: Adult Health

## 2015-08-15 DIAGNOSIS — I10 Essential (primary) hypertension: Secondary | ICD-10-CM

## 2015-08-15 MED ORDER — LISINOPRIL 5 MG PO TABS: 5.0000 mg | ORAL_TABLET | Freq: Every day | ORAL | Status: DC

## 2015-08-17 ENCOUNTER — Other Ambulatory Visit: Payer: Self-pay

## 2015-10-26 ENCOUNTER — Other Ambulatory Visit: Payer: Self-pay | Admitting: Adult Health

## 2015-10-26 DIAGNOSIS — I10 Essential (primary) hypertension: Secondary | ICD-10-CM

## 2015-10-26 MED ORDER — LISINOPRIL 5 MG PO TABS: 5.0000 mg | ORAL_TABLET | Freq: Every day | ORAL | Status: DC

## 2015-11-11 ENCOUNTER — Other Ambulatory Visit: Payer: Self-pay | Admitting: Adult Health

## 2015-11-11 ENCOUNTER — Encounter: Payer: Self-pay | Admitting: Adult Health

## 2015-11-11 DIAGNOSIS — E039 Hypothyroidism, unspecified: Secondary | ICD-10-CM

## 2016-02-21 ENCOUNTER — Other Ambulatory Visit: Payer: Self-pay | Admitting: Adult Health

## 2016-02-22 ENCOUNTER — Other Ambulatory Visit: Payer: Self-pay | Admitting: Adult Health

## 2016-04-19 ENCOUNTER — Ambulatory Visit: Payer: BLUE CROSS/BLUE SHIELD | Admitting: Adult Health

## 2016-04-19 DIAGNOSIS — Z0289 Encounter for other administrative examinations: Secondary | ICD-10-CM

## 2016-05-15 ENCOUNTER — Ambulatory Visit (INDEPENDENT_AMBULATORY_CARE_PROVIDER_SITE_OTHER): Payer: BLUE CROSS/BLUE SHIELD | Admitting: Adult Health

## 2016-05-15 ENCOUNTER — Encounter: Payer: Self-pay | Admitting: Adult Health

## 2016-05-15 VITALS — BP 118/82 | HR 107 | Temp 98.3°F | Ht 64.0 in | Wt 238.0 lb

## 2016-05-15 DIAGNOSIS — R1013 Epigastric pain: Secondary | ICD-10-CM

## 2016-05-15 LAB — CBC WITH DIFFERENTIAL/PLATELET
Basophils Absolute: 0 10*3/uL (ref 0.0–0.1)
Basophils Relative: 0.3 % (ref 0.0–3.0)
EOS ABS: 0.1 10*3/uL (ref 0.0–0.7)
Eosinophils Relative: 1.1 % (ref 0.0–5.0)
HEMATOCRIT: 48 % — AB (ref 36.0–46.0)
HEMOGLOBIN: 16.5 g/dL — AB (ref 12.0–15.0)
LYMPHS PCT: 23.9 % (ref 12.0–46.0)
Lymphs Abs: 1.7 10*3/uL (ref 0.7–4.0)
MCHC: 34.3 g/dL (ref 30.0–36.0)
MCV: 89.8 fl (ref 78.0–100.0)
MONO ABS: 0.8 10*3/uL (ref 0.1–1.0)
Monocytes Relative: 11.7 % (ref 3.0–12.0)
Neutro Abs: 4.5 10*3/uL (ref 1.4–7.7)
Neutrophils Relative %: 63 % (ref 43.0–77.0)
Platelets: 375 10*3/uL (ref 150.0–400.0)
RBC: 5.34 Mil/uL — ABNORMAL HIGH (ref 3.87–5.11)
RDW: 13.3 % (ref 11.5–15.5)
WBC: 7.1 10*3/uL (ref 4.0–10.5)

## 2016-05-15 LAB — H. PYLORI ANTIBODY, IGG: H PYLORI IGG: NEGATIVE

## 2016-05-15 LAB — LIPASE: LIPASE: 7 U/L — AB (ref 11.0–59.0)

## 2016-05-15 MED ORDER — PANTOPRAZOLE SODIUM 40 MG PO TBEC: 40.0000 mg | DELAYED_RELEASE_TABLET | Freq: Every day | ORAL | 3 refills | Status: DC

## 2016-05-15 MED ORDER — ONDANSETRON HCL 4 MG PO TABS: 4.0000 mg | ORAL_TABLET | Freq: Three times a day (TID) | ORAL | 0 refills | Status: DC | PRN

## 2016-05-15 MED ORDER — DICYCLOMINE HCL 10 MG PO CAPS: 10.0000 mg | ORAL_CAPSULE | Freq: Three times a day (TID) | ORAL | 0 refills | Status: DC

## 2016-05-15 NOTE — Progress Notes (Signed)
Pre visit review using our clinic review tool, if applicable. No additional management support is needed unless otherwise documented below in the visit note. 

## 2016-05-15 NOTE — Progress Notes (Signed)
   Subjective:    Patient ID: COETTA GULLICKSON, female    DOB: 1951-09-18, 65 y.o.   MRN: [id number]  HPI  64 year old female who presents to the office today for the acute complaint of nausea, fatigue and abdominal cramping for one week. She reports that last week she went out to eat and about two hours after coming home she had severe abdominal cramping with associated nausea. The next day she continued to have abdominal cramping and nausea with fatigue. As the week went on she started to notice that about 30 min - 1 hour after eating something, she would have abdominal cramping and  nausea.   She denies fevers, vomiting, diarrhea, constipation.    Review of Systems  Constitutional: Negative for activity change and appetite change.  Respiratory: Negative.   Cardiovascular: Negative.   Gastrointestinal: Positive for abdominal pain, blood in stool and nausea. Negative for constipation, diarrhea, rectal pain and vomiting.  Musculoskeletal: Negative.   Skin: Negative.   Hematological: Negative.   All other systems reviewed and are negative.      Objective:   Physical Exam  Constitutional: She appears well-developed and well-nourished. No distress.  Cardiovascular: Normal rate, regular rhythm, normal heart sounds and intact distal pulses.  Exam reveals no gallop and no friction rub.   No murmur heard. Pulmonary/Chest: Breath sounds normal. No respiratory distress. She has no wheezes. She has no rales. She exhibits no tenderness.  Abdominal: Bowel sounds are normal. She exhibits no distension, no pulsatile liver and no mass. There is tenderness in the epigastric area and periumbilical area. There is no rigidity, no rebound, no guarding, no CVA tenderness, no tenderness at McBurney's point and negative Murphy's sign.    Skin: She is not diaphoretic.  Vitals reviewed.     Assessment & Plan:  1. Epigastric pain - Possible gastric Ulcer. No acute abdomen. Not concerned for gallstones and  she has had an appendectomy.  - CBC with Differential/Platelet - H. pylori antibody, IgG - Lipase - dicyclomine (BENTYL) 10 MG capsule; Take 1 capsule (10 mg total) by mouth 4 (four) times daily -  before meals and at bedtime.  Dispense: 30 capsule; Refill: 0 - pantoprazole (PROTONIX) 40 MG tablet; Take 1 tablet (40 mg total) by mouth daily.  Dispense: 30 tablet; Refill: 3 - ondansetron (ZOFRAN) 4 MG tablet; Take 1 tablet (4 mg total) by mouth every 8 (eight) hours as needed for nausea or vomiting.  Dispense: 20 tablet; Refill: 0 - Bland food - Follow up if no improvement in a week  Dorothyann Peng, NP

## 2016-05-15 NOTE — Patient Instructions (Addendum)
It was great seeing you again today   Your exam is consistent with a peptic ulcer.   I have sent in a prescription for Protonix. Take this daily for 2-4 weeks.   I have also sent in prescriptions Zofran and Bentyl. Take these as needed for nausea and abdominal cramping.   Start off with clear liquids and progress as tolerated. Try and stay away from acidic and spicy foods   Peptic Ulcer A peptic ulcer is a sore in the lining of your esophagus (esophageal ulcer), stomach (gastric ulcer), or in the first part of your small intestine (duodenal ulcer). The ulcer causes erosion into the deeper tissue. CAUSES  Normally, the lining of the stomach and the small intestine protects itself from the acid that digests food. The protective lining can be damaged by:  An infection caused by a bacterium called Helicobacter pylori (H. pylori).  Regular use of nonsteroidal anti-inflammatory drugs (NSAIDs), such as ibuprofen or aspirin.  Smoking tobacco. Other risk factors include being older than 52, drinking alcohol excessively, and having a family history of ulcer disease.  SYMPTOMS   Burning pain or gnawing in the area between the chest and the belly button.  Heartburn.  Nausea and vomiting.  Bloating. The pain can be worse on an empty stomach and at night. If the ulcer results in bleeding, it can cause:  Black, tarry stools.  Vomiting of bright red blood.  Vomiting of coffee-ground-looking materials. DIAGNOSIS  A diagnosis is usually made based upon your history and an exam. Other tests and procedures may be performed to find the cause of the ulcer. Finding a cause will help determine the best treatment. Tests and procedures may include:  Blood tests, stool tests, or breath tests to check for the bacterium H. pylori.  An upper gastrointestinal (GI) series of the esophagus, stomach, and small intestine.  An endoscopy to examine the esophagus, stomach, and small intestine.  A  biopsy. TREATMENT  Treatment may include:  Eliminating the cause of the ulcer, such as smoking, NSAIDs, or alcohol.  Medicines to reduce the amount of acid in your digestive tract.  Antibiotic medicines if the ulcer is caused by the H. pylori bacterium.  An upper endoscopy to treat a bleeding ulcer.  Surgery if the bleeding is severe or if the ulcer created a hole somewhere in the digestive system. HOME CARE INSTRUCTIONS   Avoid tobacco, alcohol, and caffeine. Smoking can increase the acid in the stomach, and continued smoking will impair the healing of ulcers.  Avoid foods and drinks that seem to cause discomfort or aggravate your ulcer.  Only take medicines as directed by your caregiver. Do not substitute over-the-counter medicines for prescription medicines without talking to your caregiver.  Keep any follow-up appointments and tests as directed. SEEK MEDICAL CARE IF:   Your do not improve within 7 days of starting treatment.  You have ongoing indigestion or heartburn. SEEK IMMEDIATE MEDICAL CARE IF:   You have sudden, sharp, or persistent abdominal pain.  You have bloody or dark black, tarry stools.  You vomit blood or vomit that looks like coffee grounds.  You become light-headed, weak, or feel faint.  You become sweaty or clammy. MAKE SURE YOU:   Understand these instructions.  Will watch your condition.  Will get help right away if you are not doing well or get worse.   This information is not intended to replace advice given to you by your health care provider. Make sure you discuss any  questions you have with your health care provider.   Document Released: 09/21/2000 Document Revised: 10/15/2014 Document Reviewed: 04/23/2012 Elsevier Interactive Patient Education [employer].

## 2016-05-16 ENCOUNTER — Other Ambulatory Visit: Payer: Self-pay | Admitting: Adult Health

## 2016-05-16 DIAGNOSIS — I10 Essential (primary) hypertension: Secondary | ICD-10-CM

## 2016-05-17 ENCOUNTER — Other Ambulatory Visit: Payer: Self-pay | Admitting: Adult Health

## 2016-05-17 NOTE — Telephone Encounter (Signed)
She can have thirty days. I need her to come in and have her thyroid levels checked to make sure she is on the correct dose

## 2016-05-17 NOTE — Telephone Encounter (Signed)
Last refilled 07/08/15 for #30 with 0 refills. Ok to refill?

## 2016-06-08 ENCOUNTER — Other Ambulatory Visit: Payer: Self-pay | Admitting: Adult Health

## 2016-06-08 NOTE — Telephone Encounter (Signed)
Refill for 30 days. Please call her and tell her that I need her to get her thyroid levels checked.

## 2016-06-08 NOTE — Telephone Encounter (Signed)
Rx refilled. Patient has lab appt scheduled 07/03/16

## 2016-07-01 ENCOUNTER — Other Ambulatory Visit: Payer: Self-pay | Admitting: Adult Health

## 2016-07-03 ENCOUNTER — Other Ambulatory Visit (INDEPENDENT_AMBULATORY_CARE_PROVIDER_SITE_OTHER): Payer: BLUE CROSS/BLUE SHIELD

## 2016-07-03 DIAGNOSIS — E039 Hypothyroidism, unspecified: Secondary | ICD-10-CM

## 2016-07-03 DIAGNOSIS — R8290 Unspecified abnormal findings in urine: Secondary | ICD-10-CM

## 2016-07-03 DIAGNOSIS — Z0000 Encounter for general adult medical examination without abnormal findings: Secondary | ICD-10-CM | POA: Diagnosis not present

## 2016-07-03 LAB — HEPATIC FUNCTION PANEL
ALBUMIN: 3.6 g/dL (ref 3.5–5.2)
ALT: 15 U/L (ref 0–35)
AST: 17 U/L (ref 0–37)
Alkaline Phosphatase: 88 U/L (ref 39–117)
BILIRUBIN DIRECT: 0.1 mg/dL (ref 0.0–0.3)
TOTAL PROTEIN: 6.6 g/dL (ref 6.0–8.3)
Total Bilirubin: 0.3 mg/dL (ref 0.2–1.2)

## 2016-07-03 LAB — TSH: TSH: 0.6 u[IU]/mL (ref 0.35–4.50)

## 2016-07-03 LAB — CBC WITH DIFFERENTIAL/PLATELET
BASOS ABS: 0 10*3/uL (ref 0.0–0.1)
BASOS PCT: 0.3 % (ref 0.0–3.0)
EOS ABS: 0.1 10*3/uL (ref 0.0–0.7)
Eosinophils Relative: 2.6 % (ref 0.0–5.0)
HEMATOCRIT: 39.7 % (ref 36.0–46.0)
HEMOGLOBIN: 13.5 g/dL (ref 12.0–15.0)
LYMPHS PCT: 25.7 % (ref 12.0–46.0)
Lymphs Abs: 1.3 10*3/uL (ref 0.7–4.0)
MCHC: 34 g/dL (ref 30.0–36.0)
MCV: 90.6 fl (ref 78.0–100.0)
MONO ABS: 0.5 10*3/uL (ref 0.1–1.0)
Monocytes Relative: 10.6 % (ref 3.0–12.0)
Neutro Abs: 3.1 10*3/uL (ref 1.4–7.7)
Neutrophils Relative %: 60.8 % (ref 43.0–77.0)
PLATELETS: 284 10*3/uL (ref 150.0–400.0)
RBC: 4.39 Mil/uL (ref 3.87–5.11)
RDW: 13.7 % (ref 11.5–15.5)
WBC: 5.2 10*3/uL (ref 4.0–10.5)

## 2016-07-03 LAB — POC URINALSYSI DIPSTICK (AUTOMATED)
BILIRUBIN UA: NEGATIVE
Blood, UA: NEGATIVE
GLUCOSE UA: NEGATIVE
Ketones, UA: NEGATIVE
NITRITE UA: POSITIVE
Spec Grav, UA: 1.02
Urobilinogen, UA: 0.2
pH, UA: 5.5

## 2016-07-03 LAB — BASIC METABOLIC PANEL
BUN: 19 mg/dL (ref 6–23)
CALCIUM: 8.8 mg/dL (ref 8.4–10.5)
CO2: 28 meq/L (ref 19–32)
CREATININE: 0.7 mg/dL (ref 0.40–1.20)
Chloride: 104 mEq/L (ref 96–112)
GFR: 89.38 mL/min (ref >60.00)
GLUCOSE: 97 mg/dL (ref 70–99)
POTASSIUM: 3.7 meq/L (ref 3.5–5.1)
SODIUM: 145 meq/L (ref 135–145)

## 2016-07-03 LAB — T4, FREE: Free T4: 1.23 ng/dL (ref 0.60–1.60)

## 2016-07-03 LAB — LIPID PANEL
CHOLESTEROL: 161 mg/dL (ref 0–200)
HDL: 46 mg/dL (ref >39.00)
LDL Cholesterol: 95 mg/dL (ref 0–99)
NonHDL: 114.81
TRIGLYCERIDES: 97 mg/dL (ref 0.0–149.0)
Total CHOL/HDL Ratio: 3
VLDL: 19.4 mg/dL (ref 0.0–40.0)

## 2016-07-03 LAB — T3, FREE: T3, Free: 3.1 pg/mL (ref 2.3–4.2)

## 2016-07-04 ENCOUNTER — Other Ambulatory Visit: Payer: Self-pay | Admitting: Adult Health

## 2016-07-04 NOTE — Telephone Encounter (Signed)
Ok to refill for one year  

## 2016-07-05 ENCOUNTER — Other Ambulatory Visit: Payer: Self-pay

## 2016-07-05 ENCOUNTER — Telehealth: Payer: Self-pay | Admitting: Adult Health

## 2016-07-05 MED ORDER — LEVOTHYROXINE SODIUM 150 MCG PO TABS: ORAL_TABLET | ORAL | 3 refills | Status: DC

## 2016-07-05 NOTE — Telephone Encounter (Signed)
Pharm has ? Concerning does pt still needs to come in for an appt before any refills. That was the instruction on bottle from  last refill. Please call pharm to verify. Pt has an appt on 07-10-16 for cpx

## 2016-07-05 NOTE — Telephone Encounter (Signed)
Pharmacy notified.

## 2016-07-05 NOTE — Telephone Encounter (Signed)
Patient just had lab visit - no lab appt is needed for further refills

## 2016-07-06 ENCOUNTER — Other Ambulatory Visit: Payer: Self-pay

## 2016-07-06 ENCOUNTER — Other Ambulatory Visit: Payer: Self-pay | Admitting: Adult Health

## 2016-07-06 LAB — URINE CULTURE

## 2016-07-06 MED ORDER — SULFAMETHOXAZOLE-TRIMETHOPRIM 800-160 MG PO TABS: 1.0000 | ORAL_TABLET | Freq: Two times a day (BID) | ORAL | 0 refills | Status: DC

## 2016-07-06 MED ORDER — SULFAMETHOXAZOLE-TRIMETHOPRIM 800-160 MG PO TABS
1.0000 | ORAL_TABLET | Freq: Two times a day (BID) | ORAL | 0 refills | Status: DC
Start: 2016-07-06 — End: 2016-07-10

## 2016-07-10 ENCOUNTER — Ambulatory Visit (INDEPENDENT_AMBULATORY_CARE_PROVIDER_SITE_OTHER): Payer: BLUE CROSS/BLUE SHIELD | Admitting: Adult Health

## 2016-07-10 ENCOUNTER — Encounter: Payer: Self-pay | Admitting: Adult Health

## 2016-07-10 VITALS — BP 102/70 | HR 93 | Temp 98.6°F | Resp 20 | Ht 65.5 in | Wt 241.4 lb

## 2016-07-10 DIAGNOSIS — Z0000 Encounter for general adult medical examination without abnormal findings: Secondary | ICD-10-CM

## 2016-07-10 DIAGNOSIS — I10 Essential (primary) hypertension: Secondary | ICD-10-CM | POA: Diagnosis not present

## 2016-07-10 DIAGNOSIS — E785 Hyperlipidemia, unspecified: Secondary | ICD-10-CM

## 2016-07-10 DIAGNOSIS — M25561 Pain in right knee: Secondary | ICD-10-CM

## 2016-07-10 DIAGNOSIS — E039 Hypothyroidism, unspecified: Secondary | ICD-10-CM | POA: Diagnosis not present

## 2016-07-10 NOTE — Progress Notes (Signed)
Subjective:    Patient ID: [name], female    DOB: [birth year], 65 y.o.   MRN: [id number]  HPI  Patient presents for yearly preventative medicine examination. She is a pleasant 65 year old female who  has a past medical history of Depression; Hyperlipidemia; Hypertension; and Thyroid disease.  All immunizations and health maintenance protocols were reviewed with the patient and needed orders were placed.  Medication reconciliation,  past medical history, social history, problem list and allergies were reviewed in detail with the patient  Goals were established with regard to weight loss, exercise, and diet in compliance with medications  End of life planning was discussed. She does not have an advanced directive and living will. She does not want one.   She is up to date on colonoscopy. She has seen the eye doctor and has been to her dentist. She does not want a mammogram. She does do home breast exams and has not noticed anything different. She does not want a breast exam today.   Her only complaint is that of right knee pain ( especially when laying down and twisting movements) this has been an issue since June of this year. She denies any trauma to the area. She has recently noticed that there is some mild swelling around the knee. Does not feel any loss of ROM    Review of Systems  Constitutional: Negative.   HENT: Negative.   Eyes: Negative.   Respiratory: Negative.   Cardiovascular: Negative.   Gastrointestinal: Negative.   Endocrine: Negative.   Genitourinary: Negative.   Musculoskeletal: Positive for arthralgias, joint swelling and myalgias. Negative for gait problem.  Skin: Negative.   Allergic/Immunologic: Negative.   Neurological: Negative.   Hematological: Negative.   Psychiatric/Behavioral: Negative.   All other systems reviewed and are negative.  Past Medical History:  Diagnosis Date  . Depression   . Hyperlipidemia   . Hypertension   . Thyroid disease      Social History   Social History  . Marital status: Married    Spouse name: N/A  . Number of children: N/A  . Years of education: N/A   Occupational History  . Not on file.   Social History Main Topics  . Smoking status: Never Smoker  . Smokeless tobacco: Never Used  . Alcohol use No  . Drug use: No  . Sexual activity: Not on file   Other Topics Concern  . Not on file   Social History Narrative  . No narrative on file    Past Surgical History:  Procedure Laterality Date  . APPENDECTOMY    . COLONOSCOPY    . HERNIA REPAIR      Family History  Problem Relation Age of Onset  . Hyperlipidemia Mother   . Heart disease Mother   . Macular degeneration Mother   . Dementia Mother   . Cancer Father     colon  . Colon cancer Father   . Colon cancer Maternal Grandfather     Allergies  Allergen Reactions  . Erythromycin     REACTION: Rash  . Penicillins     REACTION: Hives  . Thyroid Hormones Hives    Armor Thyroid    Current Outpatient Prescriptions on File Prior to Visit  Medication Sig Dispense Refill  . calcium carbonate (OS-CAL) 600 MG TABS Take 600 mg by mouth 2 (two) times daily with a meal.      . dicyclomine (BENTYL) 10 MG capsule Take 1 capsule (10  mg total) by mouth 4 (four) times daily -  before meals and at bedtime. (Patient not taking: Reported on 07/10/2016) 30 capsule 0  . hydrochlorothiazide (HYDRODIURIL) 25 MG tablet TAKE 1 TABLET DAILY (Patient not taking: Reported on 07/10/2016) 90 tablet 3  . levothyroxine (SYNTHROID) 150 MCG tablet TAKE 1 TABLET DAILY. (Patient not taking: Reported on 07/10/2016) 90 tablet 3  . lisinopril (PRINIVIL,ZESTRIL) 5 MG tablet TAKE 1 TABLET DAILY (Patient not taking: Reported on 07/10/2016) 90 tablet 1  . ondansetron (ZOFRAN) 4 MG tablet Take 1 tablet (4 mg total) by mouth every 8 (eight) hours as needed for nausea or vomiting. (Patient not taking: Reported on 07/10/2016) 20 tablet 0  . pantoprazole (PROTONIX) 40 MG  tablet Take 1 tablet (40 mg total) by mouth daily. (Patient not taking: Reported on 07/10/2016) 30 tablet 3   No current facility-administered medications on file prior to visit.     BP 102/70 (BP Location: Left Arm, Patient Position: Sitting, Cuff Size: Large)   Pulse 93   Temp 98.6 F (37 C) (Oral)   Resp 20   Ht 5' 5.5" (1.664 m)   Wt 241 lb 6.1 oz (109.5 kg)   SpO2 97%   BMI 39.56 kg/m       Objective:   Physical Exam  Constitutional: She is oriented to person, place, and time. She appears well-developed and well-nourished. No distress.  obese  HENT:  Head: Normocephalic and atraumatic.  Right Ear: External ear normal.  Left Ear: External ear normal.  Nose: Nose normal.  Mouth/Throat: Oropharynx is clear and moist. No oropharyngeal exudate.  Eyes: Conjunctivae and EOM are normal. Pupils are equal, round, and reactive to light. Right eye exhibits no discharge. Left eye exhibits no discharge. No scleral icterus.  Neck: Normal range of motion. Neck supple. No JVD present. No tracheal deviation present. No thyromegaly present.  Cardiovascular: Normal rate, regular rhythm, normal heart sounds and intact distal pulses.  Exam reveals no gallop and no friction rub.   No murmur heard. Pulmonary/Chest: Effort normal and breath sounds normal. No stridor. No respiratory distress. She has no wheezes. She has no rales. She exhibits no tenderness.  Abdominal: Soft. Bowel sounds are normal. She exhibits no distension and no mass. There is no tenderness. There is no rebound and no guarding.  Musculoskeletal: Normal range of motion. She exhibits edema and tenderness.       Right knee: She exhibits swelling. She exhibits no effusion, no deformity, normal alignment and no bony tenderness. Tenderness found. MCL tenderness noted. No medial joint line, no lateral joint line and no patellar tendon tenderness noted.  Mild soft tissues swelling on medial aspect of right knee.No bruising noted. Has no  issues with ROM  Lymphadenopathy:    She has no cervical adenopathy.  Neurological: She is alert and oriented to person, place, and time. She has normal reflexes. She displays normal reflexes. No cranial nerve deficit. She exhibits normal muscle tone. Coordination normal.  Skin: Skin is warm and dry. No rash noted. She is not diaphoretic. No erythema. No pallor.  Psychiatric: She has a normal mood and affect. Her behavior is normal. Judgment and thought content normal.  Nursing note and vitals reviewed.     Assessment & Plan:  1. Routine general medical examination at a health care facility - Reviewed labs in detail with patient and all questions answered.  - Advised heart healthy diet and frequent exercise - Educated on the importance of a mammogram - Follow  up in one year or sooner if needed  2. Essential hypertension - Well controlled on lisinopril 5mg  and HCTZ - No change in medication at this time   3. Hypothyroidism, unspecified type - Well controlled with Synthroid   4. Hyperlipidemia, unspecified hyperlipidemia type - Well controlled with diet  5. Acute pain of right knee - Ambulatory referral to Sports Medicine  Dorothyann Peng, NP

## 2016-07-10 NOTE — Patient Instructions (Addendum)
It was great seeing you again!  As discussed, your labs look good!   Work on diet and exercise.   Follow up with me in a year or sooner if needed  Someone from sports medicine will call to schedule your appointment  Health Maintenance, Female Adopting a healthy lifestyle and getting preventive care can go a long way to promote health and wellness. Talk with your health care provider about what schedule of regular examinations is right for you. This is a good chance for you to check in with your provider about disease prevention and staying healthy. In between checkups, there are plenty of things you can do on your own. Experts have done a lot of research about which lifestyle changes and preventive measures are most likely to keep you healthy. Ask your health care provider for more information. WEIGHT AND DIET  Eat a healthy diet  Be sure to include plenty of vegetables, fruits, low-fat dairy products, and lean protein.  Do not eat a lot of foods high in solid fats, added sugars, or salt.  Get regular exercise. This is one of the most important things you can do for your health.  Most adults should exercise for at least 150 minutes each week. The exercise should increase your heart rate and make you sweat (moderate-intensity exercise).  Most adults should also do strengthening exercises at least twice a week. This is in addition to the moderate-intensity exercise.  Maintain a healthy weight  Body mass index (BMI) is a measurement that can be used to identify possible weight problems. It estimates body fat based on height and weight. Your health care provider can help determine your BMI and help you achieve or maintain a healthy weight.  For females 12 years of age and older:   A BMI below 18.5 is considered underweight.  A BMI of 18.5 to 24.9 is normal.  A BMI of 25 to 29.9 is considered overweight.  A BMI of 30 and above is considered obese.  Watch levels of cholesterol and  blood lipids  You should start having your blood tested for lipids and cholesterol at 64 years of age, then have this test every 5 years.  You may need to have your cholesterol levels checked more often if:  Your lipid or cholesterol levels are high.  You are older than 64 years of age.  You are at high risk for heart disease.  CANCER SCREENING   Lung Cancer  Lung cancer screening is recommended for adults 67-77 years old who are at high risk for lung cancer because of a history of smoking.  A yearly low-dose CT scan of the lungs is recommended for people who:  Currently smoke.  Have quit within the past 15 years.  Have at least a 30-pack-year history of smoking. A pack year is smoking an average of one pack of cigarettes a day for 1 year.  Yearly screening should continue until it has been 15 years since you quit.  Yearly screening should stop if you develop a health problem that would prevent you from having lung cancer treatment.  Breast Cancer  Practice breast self-awareness. This means understanding how your breasts normally appear and feel.  It also means doing regular breast self-exams. Let your health care provider know about any changes, no matter how small.  If you are in your 20s or 30s, you should have a clinical breast exam (CBE) by a health care provider every 1-3 years as part of a regular  health exam.  If you are 40 or older, have a CBE every year. Also consider having a breast X-ray (mammogram) every year.  If you have a family history of breast cancer, talk to your health care provider about genetic screening.  If you are at high risk for breast cancer, talk to your health care provider about having an MRI and a mammogram every year.  Breast cancer gene (BRCA) assessment is recommended for women who have family members with BRCA-related cancers. BRCA-related cancers include:  Breast.  Ovarian.  Tubal.  Peritoneal cancers.  Results of the  assessment will determine the need for genetic counseling and BRCA1 and BRCA2 testing. Cervical Cancer Your health care provider may recommend that you be screened regularly for cancer of the pelvic organs (ovaries, uterus, and vagina). This screening involves a pelvic examination, including checking for microscopic changes to the surface of your cervix (Pap test). You may be encouraged to have this screening done every 3 years, beginning at age 27.  For women ages 70-65, health care providers may recommend pelvic exams and Pap testing every 3 years, or they may recommend the Pap and pelvic exam, combined with testing for human papilloma virus (HPV), every 5 years. Some types of HPV increase your risk of cervical cancer. Testing for HPV may also be done on women of any age with unclear Pap test results.  Other health care providers may not recommend any screening for nonpregnant women who are considered low risk for pelvic cancer and who do not have symptoms. Ask your health care provider if a screening pelvic exam is right for you.  If you have had past treatment for cervical cancer or a condition that could lead to cancer, you need Pap tests and screening for cancer for at least 20 years after your treatment. If Pap tests have been discontinued, your risk factors (such as having a new sexual partner) need to be reassessed to determine if screening should resume. Some women have medical problems that increase the chance of getting cervical cancer. In these cases, your health care provider may recommend more frequent screening and Pap tests. Colorectal Cancer  This type of cancer can be detected and often prevented.  Routine colorectal cancer screening usually begins at 64 years of age and continues through 64 years of age.  Your health care provider may recommend screening at an earlier age if you have risk factors for colon cancer.  Your health care provider may also recommend using home test kits  to check for hidden blood in the stool.  A small camera at the end of a tube can be used to examine your colon directly (sigmoidoscopy or colonoscopy). This is done to check for the earliest forms of colorectal cancer.  Routine screening usually begins at age 78.  Direct examination of the colon should be repeated every 5-10 years through 64 years of age. However, you may need to be screened more often if early forms of precancerous polyps or small growths are found. Skin Cancer  Check your skin from head to toe regularly.  Tell your health care provider about any new moles or changes in moles, especially if there is a change in a mole's shape or color.  Also tell your health care provider if you have a mole that is larger than the size of a pencil eraser.  Always use sunscreen. Apply sunscreen liberally and repeatedly throughout the day.  Protect yourself by wearing long sleeves, pants, a wide-brimmed hat, and  sunglasses whenever you are outside. HEART DISEASE, DIABETES, AND HIGH BLOOD PRESSURE   High blood pressure causes heart disease and increases the risk of stroke. High blood pressure is more likely to develop in:  People who have blood pressure in the high end of the normal range (130-139/85-89 mm Hg).  People who are overweight or obese.  People who are African American.  If you are 71-77 years of age, have your blood pressure checked every 3-5 years. If you are 17 years of age or older, have your blood pressure checked every year. You should have your blood pressure measured twice--once when you are at a hospital or clinic, and once when you are not at a hospital or clinic. Record the average of the two measurements. To check your blood pressure when you are not at a hospital or clinic, you can use:  An automated blood pressure machine at a pharmacy.  A home blood pressure monitor.  If you are between 67 years and 74 years old, ask your health care provider if you should  take aspirin to prevent strokes.  Have regular diabetes screenings. This involves taking a blood sample to check your fasting blood sugar level.  If you are at a normal weight and have a low risk for diabetes, have this test once every three years after 64 years of age.  If you are overweight and have a high risk for diabetes, consider being tested at a younger age or more often. PREVENTING INFECTION  Hepatitis B  If you have a higher risk for hepatitis B, you should be screened for this virus. You are considered at high risk for hepatitis B if:  You were born in a country where hepatitis B is common. Ask your health care provider which countries are considered high risk.  Your parents were born in a high-risk country, and you have not been immunized against hepatitis B (hepatitis B vaccine).  You have HIV or AIDS.  You use needles to inject street drugs.  You live with someone who has hepatitis B.  You have had sex with someone who has hepatitis B.  You get hemodialysis treatment.  You take certain medicines for conditions, including cancer, organ transplantation, and autoimmune conditions. Hepatitis C  Blood testing is recommended for:  Everyone born from [birth year] through 1964.  Anyone with known risk factors for hepatitis C. Sexually transmitted infections (STIs)  You should be screened for sexually transmitted infections (STIs) including gonorrhea and chlamydia if:  You are sexually active and are younger than 64 years of age.  You are older than 64 years of age and your health care provider tells you that you are at risk for this type of infection.  Your sexual activity has changed since you were last screened and you are at an increased risk for chlamydia or gonorrhea. Ask your health care provider if you are at risk.  If you do not have HIV, but are at risk, it may be recommended that you take a prescription medicine daily to prevent HIV infection. This is called  pre-exposure prophylaxis (PrEP). You are considered at risk if:  You are sexually active and do not regularly use condoms or know the HIV status of your partner(s).  You take drugs by injection.  You are sexually active with a partner who has HIV. Talk with your health care provider about whether you are at high risk of being infected with HIV. If you choose to begin PrEP, you should first  be tested for HIV. You should then be tested every 3 months for as long as you are taking PrEP.  PREGNANCY   If you are premenopausal and you may become pregnant, ask your health care provider about preconception counseling.  If you may become pregnant, take 400 to 800 micrograms (mcg) of folic acid every day.  If you want to prevent pregnancy, talk to your health care provider about birth control (contraception). OSTEOPOROSIS AND MENOPAUSE   Osteoporosis is a disease in which the bones lose minerals and strength with aging. This can result in serious bone fractures. Your risk for osteoporosis can be identified using a bone density scan.  If you are 42 years of age or older, or if you are at risk for osteoporosis and fractures, ask your health care provider if you should be screened.  Ask your health care provider whether you should take a calcium or vitamin D supplement to lower your risk for osteoporosis.  Menopause may have certain physical symptoms and risks.  Hormone replacement therapy may reduce some of these symptoms and risks. Talk to your health care provider about whether hormone replacement therapy is right for you.  HOME CARE INSTRUCTIONS   Schedule regular health, dental, and eye exams.  Stay current with your immunizations.   Do not use any tobacco products including cigarettes, chewing tobacco, or electronic cigarettes.  If you are pregnant, do not drink alcohol.  If you are breastfeeding, limit how much and how often you drink alcohol.  Limit alcohol intake to no more than 1  drink per day for nonpregnant women. One drink equals 12 ounces of beer, 5 ounces of wine, or 1 ounces of hard liquor.  Do not use street drugs.  Do not share needles.  Ask your health care provider for help if you need support or information about quitting drugs.  Tell your health care provider if you often feel depressed.  Tell your health care provider if you have ever been abused or do not feel safe at home.   This information is not intended to replace advice given to you by your health care provider. Make sure you discuss any questions you have with your health care provider.   Document Released: 04/09/2011 Document Revised: 10/15/2014 Document Reviewed: 08/26/2013 Elsevier Interactive Patient Education [employer].

## 2016-07-10 NOTE — Progress Notes (Signed)
Pre visit review using our clinic review tool, if applicable. No additional management support is needed unless otherwise documented below in the visit note. 

## 2016-07-19 NOTE — Progress Notes (Signed)
[name] [address], [place] [address] Phone: [phone] Subjective:    I'm seeing this patient by the request  of:  Dorothyann Peng, NP   CC: Right knee pain  [id number]  [name] is a 65 y.o. female coming in with complaint of right knee pain. Patient states that his been going on for months. Does not remember any true injury. States that there was some mild swelling at one point. Does feel like she has lost any range of motion. No radiation of pain. Seems to be more constant and worse with twisting motion. Sometimes can hurt her at night. Rates the severity pain is 6 out of 10.     Past Medical History:  Diagnosis Date  . Depression   . Hyperlipidemia   . Hypertension   . Thyroid disease    Past Surgical History:  Procedure Laterality Date  . APPENDECTOMY    . COLONOSCOPY    . HERNIA REPAIR     Social History   Social History  . Marital status: Married    Spouse name: N/A  . Number of children: N/A  . Years of education: N/A   Social History Main Topics  . Smoking status: Never Smoker  . Smokeless tobacco: Never Used  . Alcohol use No  . Drug use: No  . Sexual activity: Not Asked   Other Topics Concern  . None   Social History Narrative  . None   Allergies  Allergen Reactions  . Erythromycin     REACTION: Rash  . Penicillins     REACTION: Hives  . Thyroid Hormones Hives    Armor Thyroid   Family History  Problem Relation Age of Onset  . Hyperlipidemia Mother   . Heart disease Mother   . Macular degeneration Mother   . Dementia Mother   . Cancer Father     colon  . Colon cancer Father   . Colon cancer Maternal Grandfather     Past medical history, social, surgical and family history all reviewed in electronic medical record.  No pertanent information unless stated regarding to the chief complaint.   Review of Systems: No headache, visual changes, nausea, vomiting, diarrhea, constipation,  dizziness, abdominal pain, skin rash, fevers, chills, night sweats, weight loss, swollen lymph nodes, body aches, joint swelling, muscle aches, chest pain, shortness of breath, mood changes.   Objective  Blood pressure 122/82, pulse 77, weight 242 lb (109.8 kg), SpO2 97 %.  General: No apparent distress alert and oriented x3 mood and affect normal, dressed appropriately. overweight HEENT: Pupils equal, extraocular movements intact  Respiratory: Patient's speak in full sentences and does not appear short of breath  Cardiovascular: No lower extremity edema, non tender, no erythema  Skin: Warm dry intact with no signs of infection or rash on extremities or on axial skeleton.  Abdomen: Soft nontender  Neuro: Cranial nerves II through XII are intact, neurovascularly intact in all extremities with 2+ DTRs and 2+ pulses.  Lymph: No lymphadenopathy of posterior or anterior cervical chain or axillae bilaterally.  Gait  very mild antalgic gait MSK:  Non tender with full range of motion and good stability and symmetric strength and tone of shoulders, elbows, wrist, hip and ankles bilaterally.  Knee: Right Mild valgus deformity of the knee Tenderness over the medial joint line and just superior to ROM full in flexion and extension and lower leg rotation. Ligaments with solid consistent endpoints including ACL, PCL, LCL,  MCL. Pain though with testing the MCL Negative Mcmurray's, Apley's, and Thessalonian tests. painful patellar compression. Patellar glide with moderate crepitus. Patellar and quadriceps tendons unremarkable. Hamstring and quadriceps strength is normal.  Contralateral knee unremarkable valgus deformity but no instability and minimal pain.  MSK US performed of: Right knee This study was ordered, performed, and interpreted by Charlann Boxer D.O.  Knee: Patient just superior to the medial joint line does have a calcific bursitis inferior to the MCL. Well encapsulated with calcific changes.  Mild increase in Doppler flow Patient medial joint space is moderately narrowed..  IMPRESSION:  Medial bursitis of the knee with moderate osteoarthritic changes  Procedure note 97110; 15 minutes spent for Therapeutic exercises as stated in above notes.  This included exercises focusing on stretching, strengthening, with significant focus on eccentric aspects. Vastus medialis some leak strengthening as well as hip abductor strengthening given. Discussed eccentric. Proper technique shown and discussed handout in great detail with ATC.  All questions were discussed and answered.     Impression and Recommendations:     This case required medical decision making of moderate complexity.      Note: This dictation was prepared with Dragon dictation along with smaller phrase technology. Any transcriptional errors that result from this process are unintentional.

## 2016-07-20 ENCOUNTER — Encounter: Payer: Self-pay | Admitting: Family Medicine

## 2016-07-20 ENCOUNTER — Ambulatory Visit (INDEPENDENT_AMBULATORY_CARE_PROVIDER_SITE_OTHER): Payer: BLUE CROSS/BLUE SHIELD | Admitting: Family Medicine

## 2016-07-20 ENCOUNTER — Other Ambulatory Visit: Payer: Self-pay

## 2016-07-20 VITALS — BP 122/82 | HR 77 | Wt 242.0 lb

## 2016-07-20 DIAGNOSIS — G8929 Other chronic pain: Secondary | ICD-10-CM

## 2016-07-20 DIAGNOSIS — M1711 Unilateral primary osteoarthritis, right knee: Secondary | ICD-10-CM | POA: Insufficient documentation

## 2016-07-20 DIAGNOSIS — M25561 Pain in right knee: Secondary | ICD-10-CM | POA: Diagnosis not present

## 2016-07-20 DIAGNOSIS — M7140 Calcium deposit in bursa, unspecified site: Secondary | ICD-10-CM | POA: Diagnosis not present

## 2016-07-20 MED ORDER — DICLOFENAC SODIUM 2 % TD SOLN: 2.0000 "application " | Freq: Two times a day (BID) | TRANSDERMAL | 3 refills | Status: DC

## 2016-07-20 MED ORDER — VITAMIN D (ERGOCALCIFEROL) 1.25 MG (50000 UNIT) PO CAPS: 50000.0000 [IU] | ORAL_CAPSULE | ORAL | 0 refills | Status: DC

## 2016-07-20 NOTE — Patient Instructions (Signed)
Goo to see you  Ice 20 minutes 2 times daily. Usually after activity and before bed. Exercises 3 times a week.  pennsaid pinkie amount topically 2 times daily as needed.  Once weekly vitamin D for next 12 weeks.  We will watch the bursitis and if not better we will inject it.  Turmeric 500mg  twice daily  Good shoes with rigid bottom.  Jalene Mullet, [place]rell or New balance greater then 700 See me again in 4 weeks.

## 2016-07-20 NOTE — Assessment & Plan Note (Signed)
Interesting finding. Patient does have a medial bursitis of the knee. This is abnormal anatomy. Patient will be active. We discussed icing regimen and home exercises. We discussed which activities to do which was to avoid. Patient work with Product/process development scientist in July and home exercises in greater detail. Prescription dose vitamin D to help with the calcific bursitis as well as topical anti-inflammatories given. Underlying arthritis likely also contribute. Encourage weight loss in proper shoes. Follow-up again in 4 weeks

## 2016-07-20 NOTE — Assessment & Plan Note (Signed)
Patient does have degenerative right knee. Encourage weight loss in proper shoes. Will do topical anti-inflammatories. She may be a candidate for bracing, formal physical therapy, as well as injections if pain worsens.

## 2016-07-26 ENCOUNTER — Other Ambulatory Visit: Payer: BLUE CROSS/BLUE SHIELD

## 2016-07-26 DIAGNOSIS — M25561 Pain in right knee: Principal | ICD-10-CM

## 2016-07-26 DIAGNOSIS — G8929 Other chronic pain: Secondary | ICD-10-CM

## 2016-08-16 NOTE — Progress Notes (Signed)
[name] [address], [name] [address] Phone: [phone] Subjective:    I'm seeing this patient by the request  of:  Dorothyann Peng, NP   CC: Right knee pain f/u  [id number]  AL NOURY is a 65 y.o. female coming in with complaint of right knee pain. Patient was found to have moderate arthritis as well as what appeared to be a calcific bursitis of the knee on the medial aspect. Patient was to do home exercises, once weekly vitamin D, topical anti-inflammatories and home exercises. Patient states Feeling approximate 70% better. Patient states that there is not much pain. Still with certain twisting motions is some pain. Patient denies any radiation down the knee. Denies any giving out. Patient states overall she is still able to do daily activities and sleeping comfortably at night. Does feel the topical anti-inflammatory has been helpful as well.     Past Medical History:  Diagnosis Date  . Depression   . Hyperlipidemia   . Hypertension   . Thyroid disease    Past Surgical History:  Procedure Laterality Date  . APPENDECTOMY    . COLONOSCOPY    . HERNIA REPAIR     Social History   Social History  . Marital status: Married    Spouse name: [name]  . Number of children: [name]  . Years of education: [name]   Social History Main Topics  . Smoking status: Never Smoker  . Smokeless tobacco: Never Used  . Alcohol use No  . Drug use: No  . Sexual activity: Not Asked   Other Topics Concern  . None   Social History Narrative  . None   Allergies  Allergen Reactions  . Erythromycin     REACTION: Rash  . Penicillins     REACTION: Hives  . Thyroid Hormones Hives    Armor Thyroid   Family History  Problem Relation Age of Onset  . Hyperlipidemia Mother   . Heart disease Mother   . Macular degeneration Mother   . Dementia Mother   . Cancer Father     colon  . Colon cancer Father   . Colon cancer Maternal Grandfather      Past medical history, social, surgical and family history all reviewed in electronic medical record.  No pertanent information unless stated regarding to the chief complaint.   Review of Systems: No headache, visual changes, nausea, vomiting, diarrhea, constipation, dizziness, abdominal pain, skin rash, fevers, chills, night sweats, weight loss, swollen lymph nodes chest pain, shortness of breath, mood changes.   Objective  Blood pressure 114/80, pulse 73, height 5\' 5"  (1.651 m), weight 243 lb (110.2 kg), SpO2 96 %.  Systems examined below as of 08/17/16 General: NAD A&O x3 mood, affect normal Obese HEENT: Pupils equal, extraocular movements intact no nystagmus Respiratory: not short of breath at rest or with speaking Cardiovascular: No lower extremity edema, non tender Skin: Warm dry intact with no signs of infection or rash on extremities or on axial skeleton. Abdomen: Soft nontender, no masses Neuro: Cranial nerves  intact, neurovascularly intact in all extremities with 2+ DTRs and 2+ pulses. Lymph: No lymphadenopathy appreciated today  Gait  very mild antalgic gait MSK:  Non tender with full range of motion and good stability and symmetric strength and tone of shoulders, elbows, wrist, hip and ankles bilaterally.  Knee: Right Mild valgus deformity of the knee Minimal pain over the medial joint line still remaining ROM full in flexion  and extension and lower leg rotation. Ligaments with solid consistent endpoints including ACL, PCL, LCL, MCL. All ligaments appear to be intact. Negative Mcmurray's, Apley's, and Thessalonian tests. painful patellar compression didn't present Patellar glide with moderate crepitus still present. Patellar and quadriceps tendons unremarkable. Hamstring and quadriceps strength is normal.  Contralateral knee unremarkable valgus deformity but no instability and minimal pain.     Impression and Recommendations:     This case required medical decision  making of moderate complexity.      Note: This dictation was prepared with Dragon dictation along with smaller phrase technology. Any transcriptional errors that result from this process are unintentional.

## 2016-08-17 ENCOUNTER — Ambulatory Visit (INDEPENDENT_AMBULATORY_CARE_PROVIDER_SITE_OTHER): Payer: BLUE CROSS/BLUE SHIELD | Admitting: Family Medicine

## 2016-08-17 ENCOUNTER — Encounter: Payer: Self-pay | Admitting: Family Medicine

## 2016-08-17 DIAGNOSIS — M7140 Calcium deposit in bursa, unspecified site: Secondary | ICD-10-CM

## 2016-08-17 DIAGNOSIS — M1711 Unilateral primary osteoarthritis, right knee: Secondary | ICD-10-CM

## 2016-08-17 NOTE — Assessment & Plan Note (Signed)
Seems significantly better than previous exam. We'll continue to monitor. Discussed home exercises as discussed continuing vitamin D supplementation will do this for a total of 12 weeks and then go back to daily. Follow-up again in 6 weeks

## 2016-08-17 NOTE — Assessment & Plan Note (Signed)
Significant better at this time. Doing well. Continue conservative therapy. Follow-up in 6 weeks. Worsening symptoms consider injections as well as formal physical therapy.

## 2016-08-17 NOTE — Patient Instructions (Signed)
Good to se eyou  Overall you are doing great  The vitamin D is doing well Keep up with the exercises 2-3 times a week.  hiatal hernia See me again in 6 weeks.

## 2016-08-17 NOTE — Assessment & Plan Note (Signed)
Encourage weight loss and how this will be beneficial for patient chronic joint problems.

## 2016-09-27 ENCOUNTER — Other Ambulatory Visit: Payer: Self-pay | Admitting: Adult Health

## 2016-09-27 NOTE — Telephone Encounter (Signed)
Do not refill. She should not need this medication any longer. Have her stop the medication and let me know if her stomach pain returns

## 2016-09-27 NOTE — Telephone Encounter (Signed)
Left message for patient to return phone call.  

## 2016-10-06 NOTE — Progress Notes (Deleted)
[name] [place], [name] [id number] Phone: [phone] Subjective:    I'm seeing this patient by the request  of:  Martha Peng, NP   CC: Right knee pain f/u  [id number]  [name] [name] is a 65 y.o. female coming in with complaint of right knee pain. Patient was found to have moderate arthritis as well as what appeared to be a calcific bursitis of the knee on the medial aspect. Patient was to do home exercises, once weekly vitamin D, topical anti-inflammatories and home exercises. Patient states Feeling approximate 70% better. Patient states that there is not much pain. Still with certain twisting motions is some pain. Patient denies any radiation down the knee. Denies any giving out. Patient states overall she is still able to do daily activities and sleeping comfortably at night. Does feel the topical anti-inflammatory has been helpful as well.     Past Medical History:  Diagnosis Date  . Depression   . Hyperlipidemia   . Hypertension   . Thyroid disease    Past Surgical History:  Procedure Laterality Date  . APPENDECTOMY    . COLONOSCOPY    . HERNIA REPAIR     Social History   Social History  . Marital status: Married    Spouse name: N/A  . Number of children: N/A  . Years of education: N/A   Social History Main Topics  . Smoking status: Never Smoker  . Smokeless tobacco: Never Used  . Alcohol use No  . Drug use: No  . Sexual activity: Not on file   Other Topics Concern  . Not on file   Social History Narrative  . No narrative on file   Allergies  Allergen Reactions  . Erythromycin     REACTION: Rash  . Penicillins     REACTION: Hives  . Thyroid Hormones Hives    Armor Thyroid   Family History  Problem Relation Age of Onset  . Hyperlipidemia Mother   . Heart disease Mother   . Macular degeneration Mother   . Dementia Mother   . Cancer Father     colon  . Colon cancer Father   . Colon cancer  Maternal Grandfather     Past medical history, social, surgical and family history all reviewed in electronic medical record.  No pertanent information unless stated regarding to the chief complaint.   Review of Systems: No headache, visual changes, nausea, vomiting, diarrhea, constipation, dizziness, abdominal pain, skin rash, fevers, chills, night sweats, weight loss, swollen lymph nodes chest pain, shortness of breath, mood changes.   Objective  There were no vitals taken for this visit.  Systems examined below as of 10/06/16 General: NAD A&O x3 mood, affect normal Obese HEENT: Pupils equal, extraocular movements intact no nystagmus Respiratory: not short of breath at rest or with speaking Cardiovascular: No lower extremity edema, non tender Skin: Warm dry intact with no signs of infection or rash on extremities or on axial skeleton. Abdomen: Soft nontender, no masses Neuro: Cranial nerves  intact, neurovascularly intact in all extremities with 2+ DTRs and 2+ pulses. Lymph: No lymphadenopathy appreciated today  Gait  very mild antalgic gait MSK:  Non tender with full range of motion and good stability and symmetric strength and tone of shoulders, elbows, wrist, hip and ankles bilaterally.  Knee: Right Mild valgus deformity of the knee Minimal pain over the medial joint line still remaining ROM full in flexion and extension and lower  leg rotation. Ligaments with solid consistent endpoints including ACL, PCL, LCL, MCL. All ligaments appear to be intact. Negative Mcmurray's, Apley's, and Thessalonian tests. painful patellar compression didn't present Patellar glide with moderate crepitus still present. Patellar and quadriceps tendons unremarkable. Hamstring and quadriceps strength is normal.  Contralateral knee unremarkable valgus deformity but no instability and minimal pain.     Impression and Recommendations:     This case required medical decision making of moderate  complexity.      Note: This dictation was prepared with Dragon dictation along with smaller phrase technology. Any transcriptional errors that result from this process are unintentional.

## 2016-10-09 ENCOUNTER — Ambulatory Visit: Payer: BLUE CROSS/BLUE SHIELD | Admitting: Family Medicine

## 2017-03-28 ENCOUNTER — Other Ambulatory Visit: Payer: Self-pay | Admitting: Adult Health

## 2017-05-04 ENCOUNTER — Other Ambulatory Visit: Payer: Self-pay | Admitting: Adult Health

## 2017-05-04 DIAGNOSIS — I10 Essential (primary) hypertension: Secondary | ICD-10-CM

## 2017-05-07 NOTE — Telephone Encounter (Signed)
Sent to the pharmacy by e-scribe for 90 days.  Pt has upcoming cpx on 07/12/2017

## 2017-06-28 ENCOUNTER — Encounter: Payer: Self-pay | Admitting: Adult Health

## 2017-07-05 ENCOUNTER — Telehealth: Payer: Self-pay | Admitting: Family Medicine

## 2017-07-05 NOTE — Telephone Encounter (Signed)
Pt is scheduled 07/08/17 for cpx lab work.  I tried to reach the pt by telephone on home #.  Only # we have on record.  No answer or machine.  Appt needs to be canceled and pt needs to come fasting to CPX appt.  Needs to be notified.  Will try again at a later time.

## 2017-07-05 NOTE — Telephone Encounter (Signed)
Tried to reach the pt again.  No answer or machine.

## 2017-07-08 ENCOUNTER — Other Ambulatory Visit (INDEPENDENT_AMBULATORY_CARE_PROVIDER_SITE_OTHER): Payer: BLUE CROSS/BLUE SHIELD

## 2017-07-08 DIAGNOSIS — Z0000 Encounter for general adult medical examination without abnormal findings: Secondary | ICD-10-CM

## 2017-07-08 DIAGNOSIS — E039 Hypothyroidism, unspecified: Secondary | ICD-10-CM

## 2017-07-09 ENCOUNTER — Other Ambulatory Visit: Payer: Self-pay | Admitting: Adult Health

## 2017-07-09 DIAGNOSIS — Z0000 Encounter for general adult medical examination without abnormal findings: Secondary | ICD-10-CM

## 2017-07-09 DIAGNOSIS — E039 Hypothyroidism, unspecified: Secondary | ICD-10-CM

## 2017-07-09 LAB — CBC WITH DIFFERENTIAL/PLATELET
BASOS ABS: 0 10*3/uL (ref 0.0–0.1)
Basophils Relative: 1 % (ref 0.0–3.0)
Eosinophils Absolute: 0.1 10*3/uL (ref 0.0–0.7)
Eosinophils Relative: 3.1 % (ref 0.0–5.0)
HEMATOCRIT: 42.4 % (ref 36.0–46.0)
Hemoglobin: 13.9 g/dL (ref 12.0–15.0)
LYMPHS ABS: 1.2 10*3/uL (ref 0.7–4.0)
LYMPHS PCT: 27.3 % (ref 12.0–46.0)
MCHC: 32.9 g/dL (ref 30.0–36.0)
MCV: 94 fl (ref 78.0–100.0)
MONOS PCT: 9.1 % (ref 3.0–12.0)
Monocytes Absolute: 0.4 10*3/uL (ref 0.1–1.0)
NEUTROS ABS: 2.7 10*3/uL (ref 1.4–7.7)
NEUTROS PCT: 59.5 % (ref 43.0–77.0)
PLATELETS: 297 10*3/uL (ref 150.0–400.0)
RBC: 4.51 Mil/uL (ref 3.87–5.11)
RDW: 14 % (ref 11.5–15.5)
WBC: 4.5 10*3/uL (ref 4.0–10.5)

## 2017-07-09 LAB — HEPATIC FUNCTION PANEL
ALK PHOS: 69 U/L (ref 39–117)
ALT: 16 U/L (ref 0–35)
AST: 14 U/L (ref 0–37)
Albumin: 3.8 g/dL (ref 3.5–5.2)
BILIRUBIN DIRECT: 0.1 mg/dL (ref 0.0–0.3)
TOTAL PROTEIN: 6.3 g/dL (ref 6.0–8.3)
Total Bilirubin: 0.7 mg/dL (ref 0.2–1.2)

## 2017-07-09 LAB — T3, FREE: T3 FREE: 2.5 pg/mL (ref 2.3–4.2)

## 2017-07-09 LAB — BASIC METABOLIC PANEL
BUN: 13 mg/dL (ref 6–23)
CALCIUM: 9.3 mg/dL (ref 8.4–10.5)
CHLORIDE: 103 meq/L (ref 96–112)
CO2: 33 meq/L — AB (ref 19–32)
Creatinine, Ser: 0.71 mg/dL (ref 0.40–1.20)
GFR: 87.65 mL/min (ref >60.00)
GLUCOSE: 97 mg/dL (ref 70–99)
POTASSIUM: 3.7 meq/L (ref 3.5–5.1)
SODIUM: 143 meq/L (ref 135–145)

## 2017-07-09 LAB — LIPID PANEL
CHOL/HDL RATIO: 4
Cholesterol: 195 mg/dL (ref 0–200)
HDL: 46.5 mg/dL (ref >39.00)
LDL Cholesterol: 124 mg/dL — ABNORMAL HIGH (ref 0–99)
NONHDL: 148.3
Triglycerides: 122 mg/dL (ref 0.0–149.0)
VLDL: 24.4 mg/dL (ref 0.0–40.0)

## 2017-07-09 LAB — T4, FREE: Free T4: 0.93 ng/dL (ref 0.60–1.60)

## 2017-07-09 LAB — TSH: TSH: 6.32 u[IU]/mL — ABNORMAL HIGH (ref 0.35–4.50)

## 2017-07-09 LAB — VITAMIN D 25 HYDROXY (VIT D DEFICIENCY, FRACTURES): VITD: 19.55 ng/mL — AB (ref 30.00–100.00)

## 2017-07-09 NOTE — Telephone Encounter (Signed)
Pt seen and labs ordered by [facility]

## 2017-07-09 NOTE — Progress Notes (Signed)
Encounter to enter CPE labs

## 2017-07-10 ENCOUNTER — Other Ambulatory Visit: Payer: Self-pay | Admitting: Adult Health

## 2017-07-12 ENCOUNTER — Ambulatory Visit (INDEPENDENT_AMBULATORY_CARE_PROVIDER_SITE_OTHER): Payer: BLUE CROSS/BLUE SHIELD | Admitting: Adult Health

## 2017-07-12 ENCOUNTER — Encounter: Payer: Self-pay | Admitting: Adult Health

## 2017-07-12 VITALS — BP 126/80 | Temp 98.4°F | Ht 65.25 in | Wt 258.0 lb

## 2017-07-12 DIAGNOSIS — Z780 Asymptomatic menopausal state: Secondary | ICD-10-CM | POA: Diagnosis not present

## 2017-07-12 DIAGNOSIS — I10 Essential (primary) hypertension: Secondary | ICD-10-CM | POA: Diagnosis not present

## 2017-07-12 DIAGNOSIS — E559 Vitamin D deficiency, unspecified: Secondary | ICD-10-CM

## 2017-07-12 DIAGNOSIS — Z0000 Encounter for general adult medical examination without abnormal findings: Secondary | ICD-10-CM | POA: Diagnosis not present

## 2017-07-12 DIAGNOSIS — E785 Hyperlipidemia, unspecified: Secondary | ICD-10-CM

## 2017-07-12 DIAGNOSIS — Z23 Encounter for immunization: Secondary | ICD-10-CM | POA: Diagnosis not present

## 2017-07-12 DIAGNOSIS — E039 Hypothyroidism, unspecified: Secondary | ICD-10-CM

## 2017-07-12 MED ORDER — TRIAMCINOLONE ACETONIDE 0.5 % EX OINT: 1.0000 "application " | TOPICAL_OINTMENT | Freq: Two times a day (BID) | CUTANEOUS | 3 refills | Status: DC

## 2017-07-12 NOTE — Patient Instructions (Signed)
It was great seeing you today.

## 2017-07-12 NOTE — Progress Notes (Signed)
Subjective:    Patient ID: [name], female    DOB: 1951/03/27, 66 y.o.   MRN: [id number]  HPI Patient presents for yearly preventative medicine examination.She is a pleasant 66 year old female who  has a past medical history of Depression; Hyperlipidemia; Hypertension; and Thyroid disease.  She takes Synthroid 150 mcg for hypothyroidism   She takes lisinopril 5 mg and HCTZ 25 mg for hypertension - stable   She has a history of vitamin D deficiency - is not currently taking any supplements   All immunizations and health maintenance protocols were reviewed with the patient and needed orders were placed.  Medication reconciliation,  past medical history, social history, problem list and allergies were reviewed in detail with the patient  Goals were established with regard to weight loss, exercise, and  diet in compliance with medications. She reports " I took the summer off and I know my weight is up." She is interested in seeing weight loss management   Wt Readings from Last 3 Encounters:  07/12/17 258 lb (117 kg)  08/17/16 243 lb (110.2 kg)  07/20/16 242 lb (109.8 kg)    End of life planning was discussed.She does not want to have a living will or health care power of attorney  She is up to date on her colonoscopy. She has never had a bone density screen done . She does want a mammogram and bone density screen . She does do home breast exams and has not noticed any changes in her breasts. She does not want a breast exam today.    She denies any   Review of Systems  Constitutional: Negative.   HENT: Negative.   Eyes: Negative.   Respiratory: Negative.   Cardiovascular: Negative.   Gastrointestinal: Negative.   Endocrine: Negative.   Genitourinary: Negative.   Musculoskeletal: Negative.   Skin: Negative.   Allergic/Immunologic: Negative.   Neurological: Negative.   Hematological: Negative.   Psychiatric/Behavioral: Negative.    Past Medical History:  Diagnosis  Date  . Depression   . Hyperlipidemia   . Hypertension   . Thyroid disease     Social History   Social History  . Marital status: Married    Spouse name: N/A  . Number of children: N/A  . Years of education: N/A   Occupational History  . Not on file.   Social History Main Topics  . Smoking status: Never Smoker  . Smokeless tobacco: Never Used  . Alcohol use No  . Drug use: No  . Sexual activity: Not on file   Other Topics Concern  . Not on file   Social History Narrative  . No narrative on file    Past Surgical History:  Procedure Laterality Date  . APPENDECTOMY    . COLONOSCOPY    . HERNIA REPAIR      Family History  Problem Relation Age of Onset  . Hyperlipidemia Mother   . Heart disease Mother   . Macular degeneration Mother   . Dementia Mother   . Cancer Father        colon  . Colon cancer Father   . Colon cancer Maternal Grandfather     Allergies  Allergen Reactions  . Erythromycin     REACTION: Rash  . Penicillins     REACTION: Hives  . Thyroid Hormones Hives    Armor Thyroid    Current Outpatient Prescriptions on File Prior to Visit  Medication Sig Dispense Refill  . calcium carbonate (  OS-CAL) 600 MG TABS Take 600 mg by mouth 2 (two) times daily with a meal.      . hydrochlorothiazide (HYDRODIURIL) 25 MG tablet TAKE 1 TABLET DAILY 90 tablet 0  . levothyroxine (SYNTHROID) 150 MCG tablet TAKE 1 TABLET DAILY. 90 tablet 3  . lisinopril (PRINIVIL,ZESTRIL) 5 MG tablet TAKE 1 TABLET DAILY 90 tablet 1  . Diclofenac Sodium (PENNSAID) 2 % SOLN Place 2 application onto the skin 2 (two) times daily. (Patient not taking: Reported on 07/12/2017) 112 g 3   No current facility-administered medications on file prior to visit.     BP 126/80 (BP Location: Left Arm)   Temp 98.4 F (36.9 C) (Oral)   Ht 5' 5.25" (1.657 m)   Wt 258 lb (117 kg)   BMI 42.61 kg/m       Objective:   Physical Exam  Constitutional: She is oriented to person, place, and  time. She appears well-developed and well-nourished. No distress.  obese  HENT:  Head: Normocephalic and atraumatic.  Right Ear: External ear normal.  Left Ear: External ear normal.  Nose: Nose normal.  Mouth/Throat: Oropharynx is clear and moist. No oropharyngeal exudate.  Eyes: Pupils are equal, round, and reactive to light. Conjunctivae and EOM are normal. Right eye exhibits no discharge. Left eye exhibits no discharge. No scleral icterus.  Neck: Normal range of motion. Neck supple. No JVD present. No tracheal deviation present. No thyromegaly present.  Cardiovascular: Normal rate, regular rhythm, normal heart sounds and intact distal pulses.  Exam reveals no gallop and no friction rub.   No murmur heard. Pulmonary/Chest: Effort normal and breath sounds normal. No stridor. No respiratory distress. She has no wheezes. She has no rales. She exhibits no tenderness.  Abdominal: Soft. Bowel sounds are normal. She exhibits no distension and no mass. There is no tenderness. There is no rebound and no guarding.  Genitourinary:  Genitourinary Comments: Refused   Musculoskeletal: Normal range of motion. She exhibits no edema, tenderness or deformity.  Lymphadenopathy:    She has no cervical adenopathy.  Neurological: She is alert and oriented to person, place, and time. She has normal reflexes. She displays normal reflexes. No cranial nerve deficit. She exhibits normal muscle tone. Coordination normal.  Skin: Skin is warm and dry. No rash noted. She is not diaphoretic. No erythema. No pallor.  Psychiatric: She has a normal mood and affect. Her behavior is normal. Judgment and thought content normal.  Nursing note and vitals reviewed.     Assessment & Plan:  1. Routine general medical examination at a health care facility - Reviewed labs in detail with patient and all questions answered.  - Advised heart healthy diet and frequent exercise in order to lose weight  - Educated on the importance of  a mammogram- she still refuses  - Follow up in one year or sooner if needed - Prevnar and High dose flu given   2. MORBID OBESITY - Educated on life style modifications  - AMB refer to weight loss management   3. Hyperlipidemia, unspecified hyperlipidemia type - Stable  - LDL cholesterol is elevated  - 10 year cardiac risk 5.9% - No indication for statin  - Educated on life style modification    4. Hypothyroidism, unspecified type - TSH slightly elevated.  - Free T3 and Free T4 are WNL  - No change in medication at this time   5. Essential hypertension - Well controlled.  - No change in medications  6. Post-menopausal  -  MM DIGITAL SCREENING BILATERAL; Future - DG Bone Density; Future  7. Vitamin D deficiency -2000 units daily    Dorothyann Peng, NP

## 2017-07-22 ENCOUNTER — Other Ambulatory Visit: Payer: Self-pay | Admitting: Adult Health

## 2017-07-22 DIAGNOSIS — Z1231 Encounter for screening mammogram for malignant neoplasm of breast: Secondary | ICD-10-CM

## 2017-07-29 ENCOUNTER — Other Ambulatory Visit: Payer: Self-pay | Admitting: Adult Health

## 2017-07-29 DIAGNOSIS — I10 Essential (primary) hypertension: Secondary | ICD-10-CM

## 2017-07-30 NOTE — Telephone Encounter (Signed)
Sent to the pharmacy by e-scribe. 

## 2017-08-13 ENCOUNTER — Ambulatory Visit
Admission: RE | Admit: 2017-08-13 | Discharge: 2017-08-13 | Disposition: A | Discharge status: Home / Self Care | Payer: BLUE CROSS/BLUE SHIELD | Source: Ambulatory Visit | Attending: Adult Health | Admitting: Adult Health

## 2017-08-13 DIAGNOSIS — Z1231 Encounter for screening mammogram for malignant neoplasm of breast: Secondary | ICD-10-CM

## 2017-08-13 DIAGNOSIS — M810 Age-related osteoporosis without current pathological fracture: Secondary | ICD-10-CM | POA: Diagnosis not present

## 2017-08-13 DIAGNOSIS — Z780 Asymptomatic menopausal state: Secondary | ICD-10-CM | POA: Diagnosis not present

## 2017-08-28 ENCOUNTER — Encounter: Payer: Self-pay | Admitting: Adult Health

## 2017-08-28 MED ORDER — LEVOTHYROXINE SODIUM 150 MCG PO TABS: ORAL_TABLET | ORAL | 3 refills | Status: DC

## 2017-09-13 ENCOUNTER — Other Ambulatory Visit: Payer: Self-pay | Admitting: Adult Health

## 2017-09-13 MED ORDER — ALENDRONATE SODIUM 70 MG PO TABS: 70.0000 mg | ORAL_TABLET | ORAL | 3 refills | Status: DC

## 2017-09-13 MED ORDER — ALENDRONATE SODIUM 70 MG PO TABS: 70.0000 mg | ORAL_TABLET | ORAL | 0 refills | Status: DC

## 2017-09-17 ENCOUNTER — Other Ambulatory Visit: Payer: Self-pay | Admitting: Adult Health

## 2017-09-18 NOTE — Telephone Encounter (Signed)
Sent to the pharmacy by e-scribe. 

## 2017-09-24 ENCOUNTER — Encounter: Payer: Self-pay | Admitting: Adult Health

## 2017-09-26 ENCOUNTER — Encounter: Payer: Self-pay | Admitting: Adult Health

## 2017-09-26 ENCOUNTER — Other Ambulatory Visit: Payer: Self-pay | Admitting: Adult Health

## 2017-09-26 NOTE — Telephone Encounter (Signed)
Pt's insrance requires a PA, pt  has to have a trial on alendronate (FOSAMAX) 70 MG tablet before prolia may be started. Pt was started medication on 09/13/17. Will follow-up with pt to see how she is tolerating medication in 1-2 months.   P.Jaimes,LPN

## 2017-10-23 ENCOUNTER — Telehealth: Payer: Self-pay | Admitting: Adult Health

## 2017-10-23 NOTE — Telephone Encounter (Signed)
Following up with pt, she was started on alendronate (FOSAMAX) 70 MG on 09/13/2017. Pt not tolerating medication well. C/O increased acid reflux while taking mediation.  Please advise.

## 2017-10-23 NOTE — Telephone Encounter (Signed)
Ok to d/c Fosamax and start Prolia

## 2017-10-24 ENCOUNTER — Other Ambulatory Visit: Payer: Self-pay | Admitting: Adult Health

## 2017-10-24 MED ORDER — DENOSUMAB 60 MG/ML ~~LOC~~ SOLN: 60.0000 mg | SUBCUTANEOUS | 0 refills | Status: AC

## 2017-10-24 NOTE — Telephone Encounter (Signed)
Fosamax d/c and Prolia insurance verification was started.

## 2017-11-15 ENCOUNTER — Telehealth: Payer: Self-pay | Admitting: Adult Health

## 2017-11-15 NOTE — Telephone Encounter (Signed)
I am currently process her insurance information to obtain a summery of benefits from [place].  Which labs would you like to order for pt before her prolia injection. Please advise.

## 2017-11-19 ENCOUNTER — Other Ambulatory Visit: Payer: Self-pay | Admitting: Adult Health

## 2017-11-19 DIAGNOSIS — M810 Age-related osteoporosis without current pathological fracture: Secondary | ICD-10-CM

## 2017-11-19 NOTE — Telephone Encounter (Signed)
Vit D and BMP are fine.   Thanks

## 2017-11-19 NOTE — Telephone Encounter (Signed)
Orders placed, called to to make lab appt. No answer will call back at a later time.

## 2017-11-28 ENCOUNTER — Other Ambulatory Visit (INDEPENDENT_AMBULATORY_CARE_PROVIDER_SITE_OTHER): Payer: BLUE CROSS/BLUE SHIELD

## 2017-11-28 DIAGNOSIS — M810 Age-related osteoporosis without current pathological fracture: Secondary | ICD-10-CM

## 2017-11-28 LAB — BASIC METABOLIC PANEL
BUN: 13 mg/dL (ref 6–23)
CALCIUM: 9.6 mg/dL (ref 8.4–10.5)
CO2: 33 meq/L — AB (ref 19–32)
Chloride: 101 mEq/L (ref 96–112)
Creatinine, Ser: 0.76 mg/dL (ref 0.40–1.20)
GFR: 80.93 mL/min (ref >60.00)
GLUCOSE: 99 mg/dL (ref 70–99)
Potassium: 4.4 mEq/L (ref 3.5–5.1)
SODIUM: 141 meq/L (ref 135–145)

## 2017-11-28 LAB — VITAMIN D 25 HYDROXY (VIT D DEFICIENCY, FRACTURES): VITD: 33.84 ng/mL (ref 30.00–100.00)

## 2017-12-03 ENCOUNTER — Ambulatory Visit (INDEPENDENT_AMBULATORY_CARE_PROVIDER_SITE_OTHER): Payer: BLUE CROSS/BLUE SHIELD | Admitting: Family Medicine

## 2017-12-03 DIAGNOSIS — M810 Age-related osteoporosis without current pathological fracture: Secondary | ICD-10-CM

## 2017-12-03 MED ORDER — DENOSUMAB 60 MG/ML ~~LOC~~ SOLN
60.0000 mg | Freq: Once | SUBCUTANEOUS | Status: AC
  Administered 2017-12-03: 60 mg via SUBCUTANEOUS

## 2017-12-03 NOTE — Progress Notes (Signed)
Per orders of Dorothyann Peng NP, injection of Prolia 60 mg given by Aggie Hacker ANN. Patient tolerated injection well.

## 2018-01-31 ENCOUNTER — Other Ambulatory Visit: Payer: Self-pay | Admitting: Adult Health

## 2018-01-31 NOTE — Telephone Encounter (Signed)
FILLED ON 08/28/2017 FOR 1 YEAR.  MESSAGE SENT TO THE PHARMACY.

## 2018-04-15 ENCOUNTER — Encounter: Payer: Self-pay | Admitting: Adult Health

## 2018-05-12 ENCOUNTER — Telehealth: Payer: Self-pay | Admitting: *Deleted

## 2018-05-12 NOTE — Telephone Encounter (Signed)
Request insurance verification for Prolia.  Due 05/14/18

## 2018-05-20 ENCOUNTER — Encounter: Payer: Self-pay | Admitting: Adult Health

## 2018-05-21 NOTE — Telephone Encounter (Signed)
Prior Josem Kaufmann is required.  Started in Covermymeds.

## 2018-06-24 ENCOUNTER — Telehealth: Payer: Self-pay | Admitting: Adult Health

## 2018-06-24 MED ORDER — LISINOPRIL 5 MG PO TABS: 5.0000 mg | ORAL_TABLET | Freq: Every day | ORAL | 0 refills | Status: DC

## 2018-06-24 NOTE — Telephone Encounter (Signed)
Copied from [place] [phone]. Topic: Quick Communication - See Telephone Encounter >> Jun 24, 2018  2:27 PM Conception Chancy, NT wrote: CRM for notification. See Telephone encounter for: 06/24/18.  [facility] is requesting a refill on lisinopril (PRINIVIL,ZESTRIL) 5 MG tablet for the patient.  Cb# [id number]  Reference# [id number]  [facility] [place] [place] [facility] [address] [place] [address] Phone: [phone] Fax: [phone]

## 2018-06-30 NOTE — Telephone Encounter (Signed)
Spoke with [facility] and waiting on confirmation letter

## 2018-07-04 NOTE — Telephone Encounter (Signed)
Prior Authorization has been approved 06/30/18 - 06/30/20.  Cost of the Prolia injection: $220 for injection $20 office visit   Patient is aware and an appointment made.

## 2018-07-07 ENCOUNTER — Ambulatory Visit (INDEPENDENT_AMBULATORY_CARE_PROVIDER_SITE_OTHER): Payer: BLUE CROSS/BLUE SHIELD | Admitting: *Deleted

## 2018-07-07 DIAGNOSIS — Z23 Encounter for immunization: Secondary | ICD-10-CM

## 2018-07-07 DIAGNOSIS — M810 Age-related osteoporosis without current pathological fracture: Secondary | ICD-10-CM | POA: Diagnosis not present

## 2018-07-07 MED ORDER — DENOSUMAB 60 MG/ML ~~LOC~~ SOSY
60.0000 mg | PREFILLED_SYRINGE | Freq: Once | SUBCUTANEOUS | Status: AC
  Administered 2018-07-07: 60 mg via SUBCUTANEOUS

## 2018-07-07 NOTE — Telephone Encounter (Signed)
Patient came in for Prolia injection 07/07/18

## 2018-07-07 NOTE — Progress Notes (Signed)
Per orders of Dorothyann Peng NP, injection of influenza and Prolia given by Westley Hummer. Patient tolerated injections well.

## 2018-07-18 ENCOUNTER — Encounter: Payer: Self-pay | Admitting: Adult Health

## 2018-07-18 ENCOUNTER — Ambulatory Visit: Payer: BLUE CROSS/BLUE SHIELD | Admitting: Adult Health

## 2018-07-18 VITALS — BP 140/68 | HR 79 | Temp 97.7°F | Ht 65.0 in | Wt 260.2 lb

## 2018-07-18 DIAGNOSIS — M810 Age-related osteoporosis without current pathological fracture: Secondary | ICD-10-CM | POA: Diagnosis not present

## 2018-07-18 DIAGNOSIS — E785 Hyperlipidemia, unspecified: Secondary | ICD-10-CM | POA: Diagnosis not present

## 2018-07-18 DIAGNOSIS — Z780 Asymptomatic menopausal state: Secondary | ICD-10-CM

## 2018-07-18 DIAGNOSIS — E039 Hypothyroidism, unspecified: Secondary | ICD-10-CM

## 2018-07-18 DIAGNOSIS — Z0000 Encounter for general adult medical examination without abnormal findings: Secondary | ICD-10-CM | POA: Diagnosis not present

## 2018-07-18 DIAGNOSIS — Z1159 Encounter for screening for other viral diseases: Secondary | ICD-10-CM | POA: Diagnosis not present

## 2018-07-18 DIAGNOSIS — I10 Essential (primary) hypertension: Secondary | ICD-10-CM

## 2018-07-18 DIAGNOSIS — Z23 Encounter for immunization: Secondary | ICD-10-CM | POA: Diagnosis not present

## 2018-07-18 LAB — T3, FREE: T3 FREE: 3.2 pg/mL (ref 2.3–4.2)

## 2018-07-18 LAB — CBC WITH DIFFERENTIAL/PLATELET
BASOS PCT: 0.6 % (ref 0.0–3.0)
Basophils Absolute: 0 10*3/uL (ref 0.0–0.1)
EOS ABS: 0.1 10*3/uL (ref 0.0–0.7)
EOS PCT: 2.4 % (ref 0.0–5.0)
HCT: 42.3 % (ref 36.0–46.0)
Hemoglobin: 14.4 g/dL (ref 12.0–15.0)
LYMPHS PCT: 23.6 % (ref 12.0–46.0)
Lymphs Abs: 1.2 10*3/uL (ref 0.7–4.0)
MCHC: 34 g/dL (ref 30.0–36.0)
MCV: 91.4 fl (ref 78.0–100.0)
MONO ABS: 0.5 10*3/uL (ref 0.1–1.0)
Monocytes Relative: 10 % (ref 3.0–12.0)
NEUTROS ABS: 3.2 10*3/uL (ref 1.4–7.7)
Neutrophils Relative %: 63.4 % (ref 43.0–77.0)
PLATELETS: 300 10*3/uL (ref 150.0–400.0)
RBC: 4.63 Mil/uL (ref 3.87–5.11)
RDW: 13.9 % (ref 11.5–15.5)
WBC: 5 10*3/uL (ref 4.0–10.5)

## 2018-07-18 LAB — BASIC METABOLIC PANEL
BUN: 15 mg/dL (ref 6–23)
CHLORIDE: 103 meq/L (ref 96–112)
CO2: 33 mEq/L — ABNORMAL HIGH (ref 19–32)
Calcium: 9.8 mg/dL (ref 8.4–10.5)
Creatinine, Ser: 0.72 mg/dL (ref 0.40–1.20)
GFR: 85.97 mL/min (ref >60.00)
Glucose, Bld: 99 mg/dL (ref 70–99)
POTASSIUM: 4.3 meq/L (ref 3.5–5.1)
Sodium: 143 mEq/L (ref 135–145)

## 2018-07-18 LAB — HEPATIC FUNCTION PANEL
ALT: 21 U/L (ref 0–35)
AST: 19 U/L (ref 0–37)
Albumin: 4.1 g/dL (ref 3.5–5.2)
Alkaline Phosphatase: 71 U/L (ref 39–117)
BILIRUBIN DIRECT: 0.1 mg/dL (ref 0.0–0.3)
BILIRUBIN TOTAL: 0.4 mg/dL (ref 0.2–1.2)
TOTAL PROTEIN: 7 g/dL (ref 6.0–8.3)

## 2018-07-18 LAB — LIPID PANEL
CHOL/HDL RATIO: 4
CHOLESTEROL: 209 mg/dL — AB (ref 0–200)
HDL: 49.1 mg/dL (ref >39.00)
LDL CALC: 137 mg/dL — AB (ref 0–99)
NonHDL: 159.41
TRIGLYCERIDES: 110 mg/dL (ref 0.0–149.0)
VLDL: 22 mg/dL (ref 0.0–40.0)

## 2018-07-18 LAB — VITAMIN D 25 HYDROXY (VIT D DEFICIENCY, FRACTURES): VITD: 31.56 ng/mL (ref 30.00–100.00)

## 2018-07-18 LAB — T4, FREE: Free T4: 1.06 ng/dL (ref 0.60–1.60)

## 2018-07-18 LAB — TSH: TSH: 4.38 u[IU]/mL (ref 0.35–4.50)

## 2018-07-18 MED ORDER — TRIAMCINOLONE ACETONIDE 0.5 % EX OINT: 1.0000 "application " | TOPICAL_OINTMENT | Freq: Two times a day (BID) | CUTANEOUS | 3 refills | Status: DC

## 2018-07-18 NOTE — Progress Notes (Signed)
Subjective:    Patient ID: [name], female    DOB: [birth year], 67 y.o.   MRN: [id number]  HPI Patient presents for yearly preventative medicine examination. She is a pleasant 67 year old female who  has a past medical history of Depression, Hyperlipidemia, Hypertension, and Thyroid disease.   Hypothyroidism - Takes synthroid 150 mcg  Lab Results  Component Value Date   TSH 6.32 (H) 07/09/2017   Essential Hypertension - Takes lisinopril 5 mg and HCTZ 25 mg - controlled BP Readings from Last 3 Encounters:  07/18/18 140/68  07/12/17 126/80  08/17/16 114/80   Osteoporosis - Currently prescribed Prolia. Last Bone Density was in 2018  She has a history of vitamin D deficiency - is not currently taking any supplements   All immunizations and health maintenance protocols were reviewed with the patient and needed orders were placed. She is due for pneumovax 23   Appropriate screening laboratory values were ordered for the patient including screening of hyperlipidemia, renal function and hepatic function. If indicated by BPH, a PSA was ordered.  Medication reconciliation,  past medical history, social history, problem list and allergies were reviewed in detail with the patient  Goals were established with regard to weight loss, exercise, and  diet in compliance with medications. She reports that she is activelly working on weight loss. Her husband was recently diagnosed as diabetic so they have been working on weight loss together.   Wt Readings from Last 3 Encounters:  07/18/18 260 lb 3.2 oz (118 kg)  07/12/17 258 lb (117 kg)  08/17/16 243 lb (110.2 kg)   End of life planning was discussed.  She is up to date on routine colonoscopies,mammogram, dental and vision screens  Review of Systems  Constitutional: Negative.   HENT: Negative.   Eyes: Negative.   Respiratory: Negative.   Cardiovascular: Negative.   Gastrointestinal: Negative.   Endocrine: Negative.     Genitourinary: Negative.   Musculoskeletal: Negative.   Skin: Negative.   Allergic/Immunologic: Negative.   Neurological: Negative.   Hematological: Negative.   Psychiatric/Behavioral: Negative.    Past Medical History:  Diagnosis Date  . Depression   . Hyperlipidemia   . Hypertension   . Thyroid disease     Social History   Socioeconomic History  . Marital status: Married    Spouse name: Not on file  . Number of children: Not on file  . Years of education: Not on file  . Highest education level: Not on file  Occupational History  . Not on file  Social Needs  . Financial resource strain: Not on file  . Food insecurity:    Worry: Not on file    Inability: Not on file  . Transportation needs:    Medical: Not on file    Non-medical: Not on file  Tobacco Use  . Smoking status: Never Smoker  . Smokeless tobacco: Never Used  Substance and Sexual Activity  . Alcohol use: No    Alcohol/week: 0.0 standard drinks  . Drug use: No  . Sexual activity: Not on file  Lifestyle  . Physical activity:    Days per week: Not on file    Minutes per session: Not on file  . Stress: Not on file  Relationships  . Social connections:    Talks on phone: Not on file    Gets together: Not on file    Attends religious service: Not on file    Active member of club or organization:  Not on file    Attends meetings of clubs or organizations: Not on file    Relationship status: Not on file  . Intimate partner violence:    Fear of current or ex partner: Not on file    Emotionally abused: Not on file    Physically abused: Not on file    Forced sexual activity: Not on file  Other Topics Concern  . Not on file  Social History Narrative  . Not on file    Past Surgical History:  Procedure Laterality Date  . APPENDECTOMY    . COLONOSCOPY    . HERNIA REPAIR      Family History  Problem Relation Age of Onset  . Hyperlipidemia Mother   . Heart disease Mother   . Macular degeneration  Mother   . Dementia Mother   . Cancer Father        colon  . Colon cancer Father   . Colon cancer Maternal Grandfather   . Breast cancer Paternal Grandmother     Allergies  Allergen Reactions  . Erythromycin     REACTION: Rash  . Penicillins     REACTION: Hives  . Thyroid Hormones Hives    Armor Thyroid    Current Outpatient Medications on File Prior to Visit  Medication Sig Dispense Refill  . calcium carbonate (OS-CAL) 600 MG TABS Take 600 mg by mouth 2 (two) times daily with a meal.      . denosumab (PROLIA) 60 MG/ML SOLN injection Inject 60 mg into the skin every 6 (six) months. Administer in upper arm, thigh, or abdomen 1 mL 0  . Diclofenac Sodium (PENNSAID) 2 % SOLN Place 2 application onto the skin 2 (two) times daily. 112 g 3  . hydrochlorothiazide (HYDRODIURIL) 25 MG tablet TAKE 1 TABLET DAILY 90 tablet 3  . levothyroxine (SYNTHROID) 150 MCG tablet TAKE 1 TABLET DAILY. 90 tablet 3  . lisinopril (PRINIVIL,ZESTRIL) 5 MG tablet Take 1 tablet (5 mg total) by mouth daily. 90 tablet 0  . triamcinolone ointment (KENALOG) 0.5 % Apply 1 application topically 2 (two) times daily. 30 g 3   No current facility-administered medications on file prior to visit.     BP 140/68 (BP Location: Left Arm, Patient Position: Sitting, Cuff Size: Large)   Pulse 79   Temp 97.7 F (36.5 C) (Oral)   Ht 5\' 5"  (1.651 m)   Wt 260 lb 3.2 oz (118 kg)   SpO2 97%   BMI 43.30 kg/m       Objective:   Physical Exam  Constitutional: She is oriented to person, place, and time. She appears well-developed and well-nourished. No distress.  Obese    HENT:  Head: Normocephalic and atraumatic.  Right Ear: External ear normal.  Left Ear: External ear normal.  Nose: Nose normal.  Mouth/Throat: Oropharynx is clear and moist. No oropharyngeal exudate.  Eyes: Pupils are equal, round, and reactive to light. Conjunctivae and EOM are normal. Right eye exhibits no discharge. Left eye exhibits no discharge. No  scleral icterus.  Neck: Normal range of motion. Neck supple. No JVD present. No tracheal deviation present. No thyromegaly present.  Cardiovascular: Normal rate, regular rhythm, normal heart sounds and intact distal pulses. Exam reveals no gallop and no friction rub.  No murmur heard. Pulmonary/Chest: Effort normal and breath sounds normal. No stridor. No respiratory distress. She has no wheezes. She has no rales. She exhibits no tenderness.  Abdominal: Soft. Bowel sounds are normal. She exhibits no distension  and no mass. There is no tenderness. There is no rebound and no guarding. No hernia.  Musculoskeletal: Normal range of motion. She exhibits no edema, tenderness or deformity.  Lymphadenopathy:    She has no cervical adenopathy.  Neurological: She is alert and oriented to person, place, and time. She displays normal reflexes. No cranial nerve deficit or sensory deficit. She exhibits normal muscle tone. Coordination normal.  Skin: Skin is warm and dry. Capillary refill takes less than 2 seconds. No rash noted. She is not diaphoretic. No erythema. No pallor.  Psychiatric: She has a normal mood and affect. Her behavior is normal. Judgment and thought content normal.  Nursing note and vitals reviewed.     Assessment & Plan:  1. Routine general medical examination at a health care facility - one year follow up  - Basic metabolic panel - CBC with Differential/Platelet - Hepatic function panel - Lipid panel - TSH  2. Essential hypertension - Near goal. Will continue to monitor  - Basic metabolic panel - CBC with Differential/Platelet - Hepatic function panel - Lipid panel - TSH  3. Hyperlipidemia, unspecified hyperlipidemia type - Consider statin  - Basic metabolic panel - CBC with Differential/Platelet - Hepatic function panel - Lipid panel - TSH  4. Hypothyroidism, unspecified type - Consider increase in synthroid - Basic metabolic panel - CBC with Differential/Platelet -  Hepatic function panel - Lipid panel - TSH - T3, Free - T4, Free  5. MORBID OBESITY - Encouraged weight loss through diet and exercise  - Basic metabolic panel - CBC with Differential/Platelet - Hepatic function panel - Lipid panel - TSH  6. Osteoporosis, unspecified osteoporosis type, unspecified pathological fracture presence  - Vitamin D, 25-hydroxy - DG Bone Density; Future  7. Post-menopausal  - MM DIGITAL SCREENING BILATERAL; Future  8. Need for vaccination against Streptococcus pneumoniae  - Pneumococcal polysaccharide vaccine 23-valent greater than or equal to 2yo subcutaneous/IM  9. Need for hepatitis C screening test  - Hep C Antibody   Dorothyann Peng, NP

## 2018-07-19 LAB — HEPATITIS C ANTIBODY
Hepatitis C Ab: NONREACTIVE
SIGNAL TO CUT-OFF: 0.02

## 2018-07-20 ENCOUNTER — Other Ambulatory Visit: Payer: Self-pay | Admitting: Adult Health

## 2018-07-20 DIAGNOSIS — I10 Essential (primary) hypertension: Secondary | ICD-10-CM

## 2018-07-22 NOTE — Telephone Encounter (Signed)
Sent to the pharmacy by e-scribe. 

## 2018-07-30 ENCOUNTER — Other Ambulatory Visit: Payer: Self-pay | Admitting: Family Medicine

## 2018-07-30 DIAGNOSIS — E785 Hyperlipidemia, unspecified: Secondary | ICD-10-CM

## 2018-07-30 NOTE — Progress Notes (Signed)
Lab order entered.

## 2018-08-29 ENCOUNTER — Other Ambulatory Visit: Payer: Self-pay | Admitting: Adult Health

## 2018-09-01 NOTE — Telephone Encounter (Signed)
Sent to the pharmacy by e-scribe. 

## 2018-09-12 ENCOUNTER — Other Ambulatory Visit: Payer: Self-pay | Admitting: Family Medicine

## 2018-09-12 MED ORDER — LISINOPRIL 5 MG PO TABS: 5.0000 mg | ORAL_TABLET | Freq: Every day | ORAL | 2 refills | Status: DC

## 2018-09-12 NOTE — Telephone Encounter (Signed)
Sent to the pharmacy by e-scribe. 

## 2018-09-29 ENCOUNTER — Ambulatory Visit
Admission: RE | Admit: 2018-09-29 | Discharge: 2018-09-29 | Disposition: A | Discharge status: Home / Self Care | Payer: BLUE CROSS/BLUE SHIELD | Source: Ambulatory Visit | Attending: Adult Health | Admitting: Adult Health

## 2018-09-29 DIAGNOSIS — M810 Age-related osteoporosis without current pathological fracture: Secondary | ICD-10-CM | POA: Diagnosis not present

## 2018-09-29 DIAGNOSIS — Z780 Asymptomatic menopausal state: Secondary | ICD-10-CM

## 2018-09-29 DIAGNOSIS — M85851 Other specified disorders of bone density and structure, right thigh: Secondary | ICD-10-CM | POA: Diagnosis not present

## 2018-09-29 DIAGNOSIS — Z1231 Encounter for screening mammogram for malignant neoplasm of breast: Secondary | ICD-10-CM | POA: Diagnosis not present

## 2018-10-29 ENCOUNTER — Telehealth: Payer: Self-pay | Admitting: *Deleted

## 2018-10-29 NOTE — Telephone Encounter (Signed)
-----   Message from Hulda Humphrey, [place] sent at 09/30/2018 12:34 PM EST ----- Regarding: Prolia Pt would like to get scheduled for next Prolia.  Wasn't sure of your process.  Looks like she is due in Feb.  Let me know if I can help you.

## 2018-11-04 NOTE — Telephone Encounter (Signed)
Last Prolia injection given 07/07/18.  Next Prolia injection due after 01/05/2019.

## 2019-01-14 ENCOUNTER — Other Ambulatory Visit: Payer: Self-pay | Admitting: Family Medicine

## 2019-01-14 MED ORDER — TRIAMCINOLONE ACETONIDE 0.5 % EX OINT: 1.0000 "application " | TOPICAL_OINTMENT | Freq: Two times a day (BID) | CUTANEOUS | 3 refills | Status: DC

## 2019-01-29 ENCOUNTER — Other Ambulatory Visit: Payer: BLUE CROSS/BLUE SHIELD

## 2019-02-13 NOTE — Telephone Encounter (Signed)
Waiting on insurance verification 02/13/2019.

## 2019-03-03 NOTE — Telephone Encounter (Signed)
Waiting on insurance verification 03/03/2019

## 2019-04-01 ENCOUNTER — Ambulatory Visit (INDEPENDENT_AMBULATORY_CARE_PROVIDER_SITE_OTHER): Payer: BC Managed Care – PPO | Admitting: *Deleted

## 2019-04-01 ENCOUNTER — Other Ambulatory Visit: Payer: Self-pay

## 2019-04-01 DIAGNOSIS — M810 Age-related osteoporosis without current pathological fracture: Secondary | ICD-10-CM

## 2019-04-01 MED ORDER — DENOSUMAB 60 MG/ML ~~LOC~~ SOSY
60.0000 mg | PREFILLED_SYRINGE | Freq: Once | SUBCUTANEOUS | Status: AC
  Administered 2019-04-01: 60 mg via SUBCUTANEOUS

## 2019-04-01 NOTE — Progress Notes (Signed)
Per orders of Sallee Provencal, injection of Prolia given by Westley Hummer. Patient tolerated injection well.

## 2019-04-17 NOTE — Telephone Encounter (Signed)
Prolia injection given to patient 04/01/2019.

## 2019-04-30 ENCOUNTER — Other Ambulatory Visit: Payer: Self-pay | Admitting: Adult Health

## 2019-05-15 ENCOUNTER — Encounter: Payer: Self-pay | Admitting: Adult Health

## 2019-05-15 DIAGNOSIS — I10 Essential (primary) hypertension: Secondary | ICD-10-CM

## 2019-05-15 MED ORDER — LISINOPRIL 5 MG PO TABS: 5.0000 mg | ORAL_TABLET | Freq: Every day | ORAL | 0 refills | Status: DC

## 2019-05-15 MED ORDER — HYDROCHLOROTHIAZIDE 25 MG PO TABS: 25.0000 mg | ORAL_TABLET | Freq: Every day | ORAL | 0 refills | Status: DC

## 2019-05-15 MED ORDER — LEVOTHYROXINE SODIUM 150 MCG PO TABS: ORAL_TABLET | ORAL | 0 refills | Status: DC

## 2019-05-15 NOTE — Telephone Encounter (Signed)
Sent to the pharmacy by e-scribe for 90 days. 

## 2019-07-23 ENCOUNTER — Other Ambulatory Visit: Payer: Self-pay | Admitting: Adult Health

## 2019-07-23 DIAGNOSIS — I10 Essential (primary) hypertension: Secondary | ICD-10-CM

## 2019-07-24 ENCOUNTER — Encounter: Payer: Self-pay | Admitting: Family Medicine

## 2019-07-24 NOTE — Telephone Encounter (Signed)
Letter released to MyChart.  Sent for 90 days.

## 2019-09-29 ENCOUNTER — Telehealth: Payer: Self-pay | Admitting: *Deleted

## 2019-09-29 NOTE — Telephone Encounter (Signed)
Insurance verification started for Prolia.  Will take up to 5 business days.

## 2019-10-02 IMAGING — MG 2D DIGITAL SCREENING BILATERAL MAMMOGRAM WITH CAD AND ADJUNCT TO
8 of 12 series · 8 of 28 positions shown · non-contrast
Comparison: None.

CLINICAL DATA: Screening.

EXAM:
2D DIGITAL SCREENING BILATERAL MAMMOGRAM WITH CAD AND ADJUNCT TOMO

[L MLO]
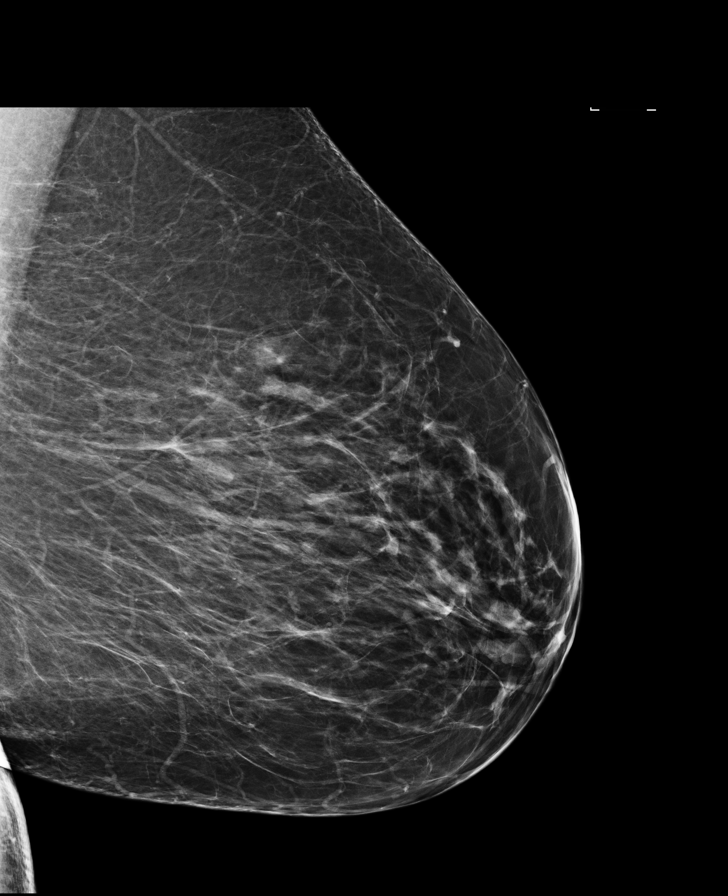

[R CC]
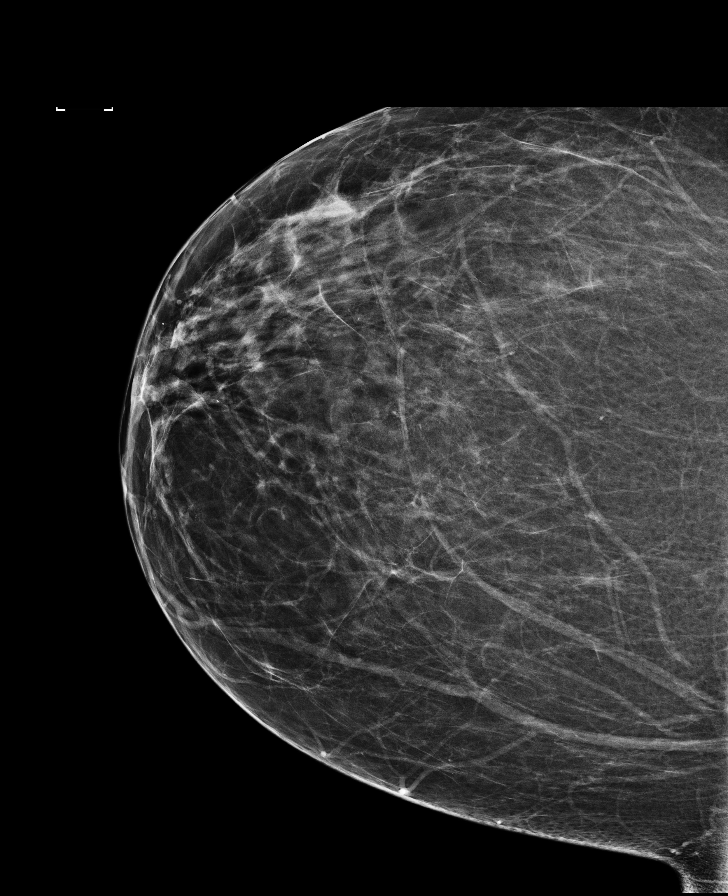

[R MLO]
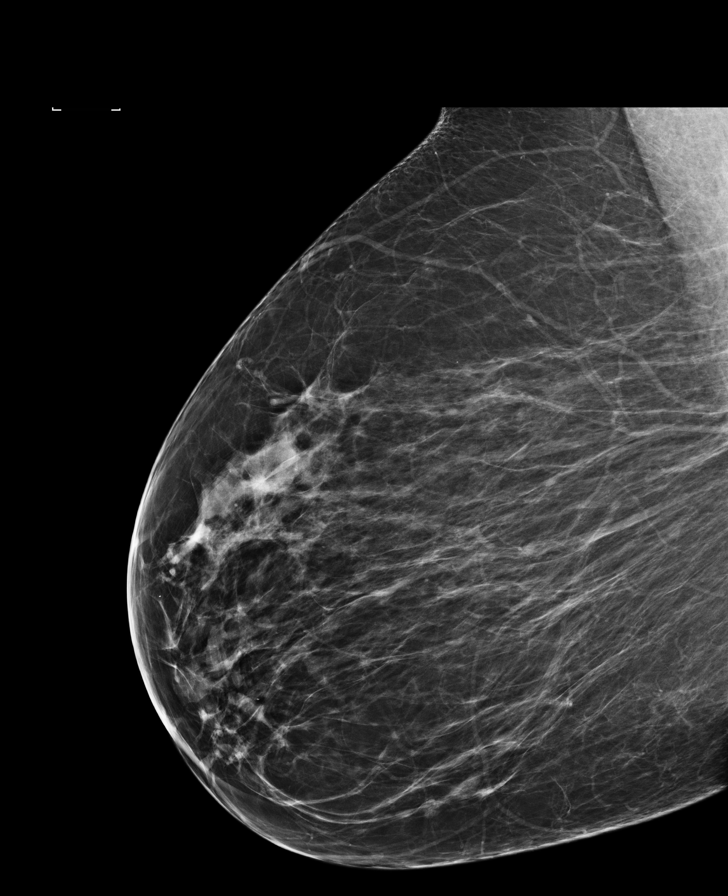

[L CC]
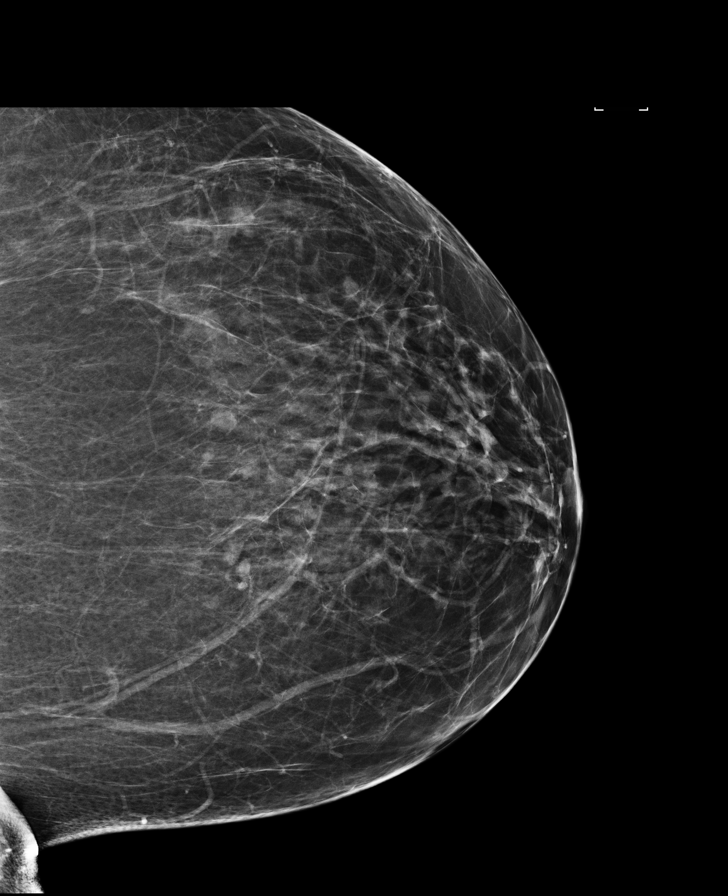

[R MLO synth-2D]
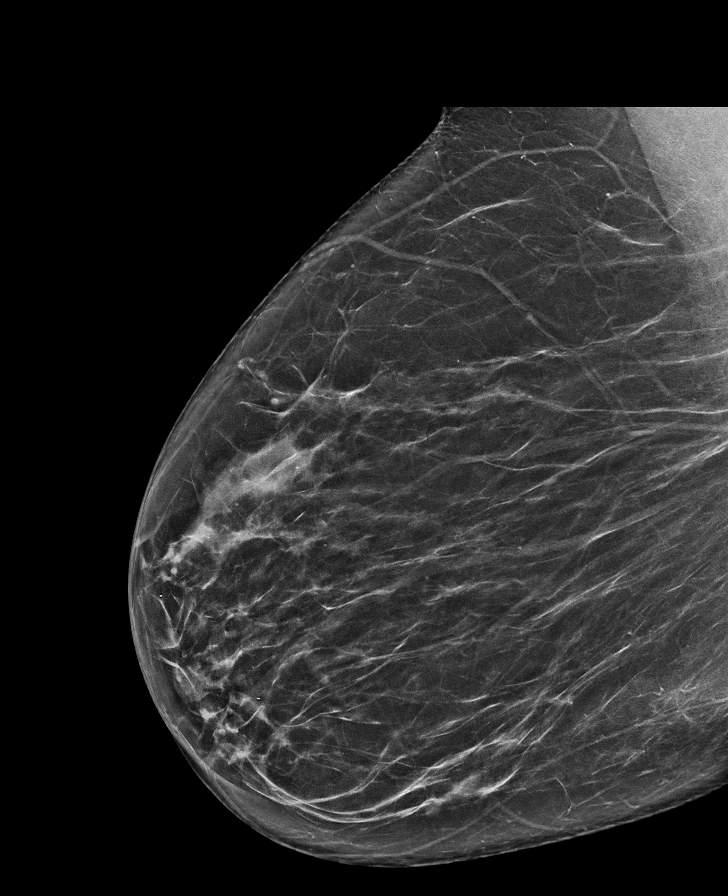

[R CC synth-2D]
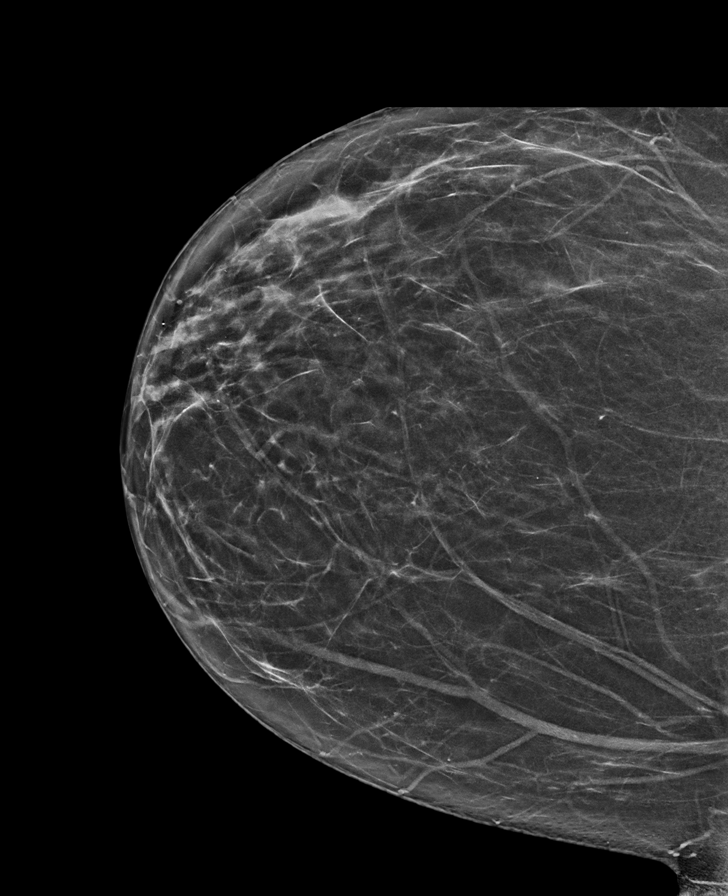

[L MLO synth-2D]
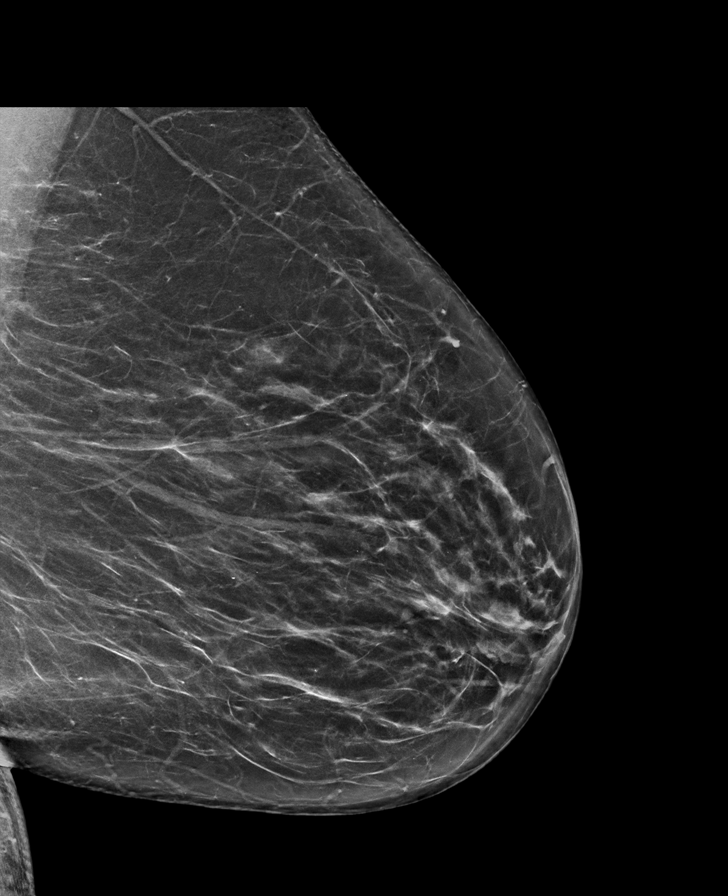

[L CC synth-2D]
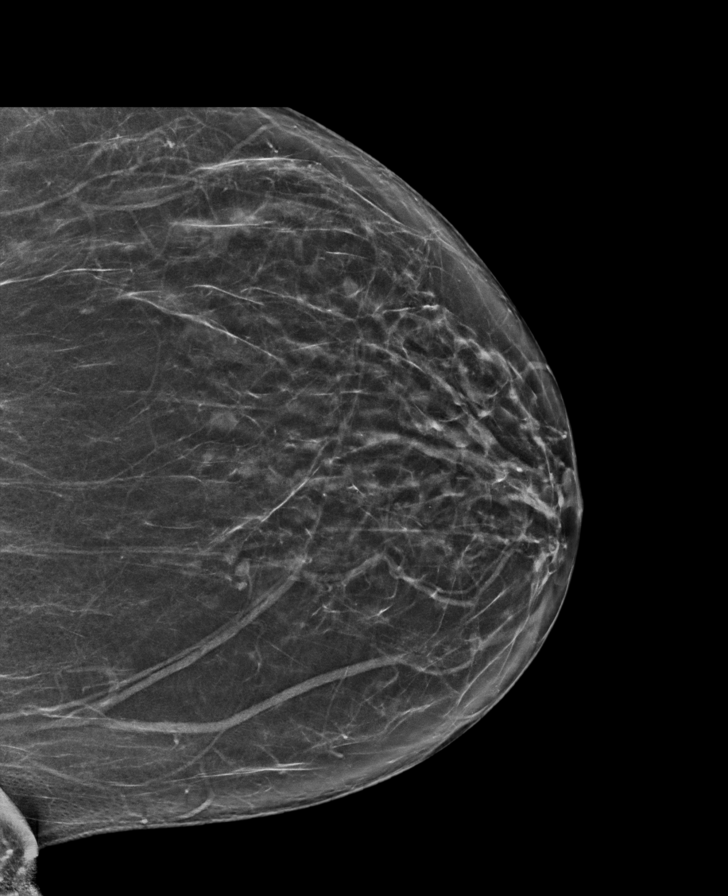

[8 of 28 positions shown; findings below may reference images not displayed]

ACR Breast Density Category b: There are scattered areas of
fibroglandular density.
FINDINGS: There are no findings suspicious for malignancy. Images were
processed with CAD.
IMPRESSION: No mammographic evidence of malignancy. A result letter of this
screening mammogram will be mailed directly to the patient.

RECOMMENDATION:
Screening mammogram in one year. (Code:EE-M-3AZ)

BI-RADS CATEGORY  1: Negative.

## 2019-10-06 NOTE — Telephone Encounter (Signed)
Spoke with patient and the cost of Prolia injection is $230 with a $35 admin fee.  Information given about [facility].  Patient will call back to schedule.

## 2019-10-06 NOTE — Telephone Encounter (Signed)
Pt called back in to schedule. Pt says that she has to have before the month is out. Not showing any openings for nurse visit, please assist pt directly.

## 2019-10-07 ENCOUNTER — Other Ambulatory Visit: Payer: Self-pay

## 2019-10-07 ENCOUNTER — Ambulatory Visit (INDEPENDENT_AMBULATORY_CARE_PROVIDER_SITE_OTHER): Payer: Medicare Other

## 2019-10-07 DIAGNOSIS — Z23 Encounter for immunization: Secondary | ICD-10-CM | POA: Diagnosis not present

## 2019-10-07 DIAGNOSIS — M810 Age-related osteoporosis without current pathological fracture: Secondary | ICD-10-CM

## 2019-10-07 MED ORDER — DENOSUMAB 60 MG/ML ~~LOC~~ SOSY
60.0000 mg | PREFILLED_SYRINGE | Freq: Once | SUBCUTANEOUS | Status: AC
  Administered 2019-10-07: 60 mg via SUBCUTANEOUS

## 2019-10-07 NOTE — Patient Instructions (Signed)
There are no preventive care reminders to display for this patient.  Depression screen PHQ 2/9 07/10/2016  Decreased Interest 0  Down, Depressed, Hopeless 0  PHQ - 2 Score 0

## 2019-10-07 NOTE — Telephone Encounter (Signed)
Pt been scheduled

## 2019-10-08 NOTE — Progress Notes (Signed)
I have reviewed and agree with the plan of Prolia injection

## 2019-12-03 ENCOUNTER — Other Ambulatory Visit: Payer: Self-pay

## 2019-12-04 ENCOUNTER — Encounter: Payer: Self-pay | Admitting: Adult Health

## 2019-12-04 ENCOUNTER — Ambulatory Visit (INDEPENDENT_AMBULATORY_CARE_PROVIDER_SITE_OTHER): Payer: Medicare Other | Admitting: Adult Health

## 2019-12-04 VITALS — BP 150/88 | Temp 98.2°F | Ht 65.0 in | Wt 277.0 lb

## 2019-12-04 DIAGNOSIS — E039 Hypothyroidism, unspecified: Secondary | ICD-10-CM | POA: Diagnosis not present

## 2019-12-04 DIAGNOSIS — E785 Hyperlipidemia, unspecified: Secondary | ICD-10-CM

## 2019-12-04 DIAGNOSIS — I10 Essential (primary) hypertension: Secondary | ICD-10-CM

## 2019-12-04 DIAGNOSIS — M810 Age-related osteoporosis without current pathological fracture: Secondary | ICD-10-CM

## 2019-12-04 DIAGNOSIS — E559 Vitamin D deficiency, unspecified: Secondary | ICD-10-CM

## 2019-12-04 LAB — COMPREHENSIVE METABOLIC PANEL
ALT: 22 U/L (ref 0–35)
AST: 21 U/L (ref 0–37)
Albumin: 4.1 g/dL (ref 3.5–5.2)
Alkaline Phosphatase: 72 U/L (ref 39–117)
BUN: 14 mg/dL (ref 6–23)
CO2: 32 mEq/L (ref 19–32)
Calcium: 9.4 mg/dL (ref 8.4–10.5)
Chloride: 103 mEq/L (ref 96–112)
Creatinine, Ser: 0.77 mg/dL (ref 0.40–1.20)
GFR: 74.55 mL/min (ref >60.00)
Glucose, Bld: 88 mg/dL (ref 70–99)
Potassium: 4.8 mEq/L (ref 3.5–5.1)
Sodium: 142 mEq/L (ref 135–145)
Total Bilirubin: 0.6 mg/dL (ref 0.2–1.2)
Total Protein: 7.2 g/dL (ref 6.0–8.3)

## 2019-12-04 LAB — CBC WITH DIFFERENTIAL/PLATELET
Basophils Absolute: 0 10*3/uL (ref 0.0–0.1)
Basophils Relative: 0.6 % (ref 0.0–3.0)
Eosinophils Absolute: 0.1 10*3/uL (ref 0.0–0.7)
Eosinophils Relative: 2.2 % (ref 0.0–5.0)
HCT: 42.9 % (ref 36.0–46.0)
Hemoglobin: 14.5 g/dL (ref 12.0–15.0)
Lymphocytes Relative: 26.4 % (ref 12.0–46.0)
Lymphs Abs: 1.4 10*3/uL (ref 0.7–4.0)
MCHC: 33.7 g/dL (ref 30.0–36.0)
MCV: 93 fl (ref 78.0–100.0)
Monocytes Absolute: 0.5 10*3/uL (ref 0.1–1.0)
Monocytes Relative: 9.9 % (ref 3.0–12.0)
Neutro Abs: 3.3 10*3/uL (ref 1.4–7.7)
Neutrophils Relative %: 60.9 % (ref 43.0–77.0)
Platelets: 309 10*3/uL (ref 150.0–400.0)
RBC: 4.61 Mil/uL (ref 3.87–5.11)
RDW: 13.9 % (ref 11.5–15.5)
WBC: 5.5 10*3/uL (ref 4.0–10.5)

## 2019-12-04 LAB — LIPID PANEL
Cholesterol: 222 mg/dL — ABNORMAL HIGH (ref 0–200)
HDL: 52.3 mg/dL (ref >39.00)
LDL Cholesterol: 151 mg/dL — ABNORMAL HIGH (ref 0–99)
NonHDL: 169.32
Total CHOL/HDL Ratio: 4
Triglycerides: 92 mg/dL (ref 0.0–149.0)
VLDL: 18.4 mg/dL (ref 0.0–40.0)

## 2019-12-04 LAB — TSH: TSH: 14.54 u[IU]/mL — ABNORMAL HIGH (ref 0.35–4.50)

## 2019-12-04 LAB — VITAMIN D 25 HYDROXY (VIT D DEFICIENCY, FRACTURES): VITD: 35.83 ng/mL (ref 30.00–100.00)

## 2019-12-04 MED ORDER — LISINOPRIL 20 MG PO TABS: 20.0000 mg | ORAL_TABLET | Freq: Every day | ORAL | 3 refills | Status: DC

## 2019-12-04 NOTE — Progress Notes (Signed)
Subjective:    Patient ID: [name], female    DOB: June 19, 1951, 69 y.o.   MRN: [id number]  HPI Patient presents for yearly preventative medicine examination. She is a pleasant 69 year old female who  has a past medical history of Depression, Hyperlipidemia, Hypertension, and Thyroid disease.  Hypothyroidism -he is currently prescribed Synthroid 150 mcg.  She does report some cold intolerance. Lab Results  Component Value Date   TSH 4.38 07/18/2018   Essential Hypertension -takes lisinopril 5 mg and hydrochlorothiazide 25 mg.  She denies dizziness, lightheadedness, chest pain, SOB, or headaches.  She has not been monitoring her blood pressures at home. BP Readings from Last 3 Encounters:  12/04/19 (!) 150/88  07/18/18 140/68  07/12/17 126/80   Osteoporosis -currently prescribed Prolia every 6 months.  Her last bone density was done in December 2019 which showed statistically significant increase in bone mineral density compared to exam 1 year earlier.  H/o Vitamin D supplement -not currently taking supplementation.  Obesity -her weight is up 17 pounds since October 2019.  She reports that her diet has been poor over the last year.  All immunizations and health maintenance protocols were reviewed with the patient and needed orders were placed. She is up to date on routine vaccinations   Appropriate screening laboratory values were ordered for the patient including screening of hyperlipidemia, renal function and hepatic function.  Medication reconciliation,  past medical history, social history, problem list and allergies were reviewed in detail with the patient  Goals were established with regard to weight loss, exercise, and  diet in compliance with medications. Her diet has suffered.  Wt Readings from Last 3 Encounters:  12/04/19 277 lb (125.6 kg)  07/18/18 260 lb 3.2 oz (118 kg)  07/12/17 258 lb (117 kg)   Review of Systems  Constitutional: Negative.   HENT: Negative.    Eyes: Negative.   Respiratory: Negative.   Cardiovascular: Negative.   Gastrointestinal: Negative.   Endocrine: Negative.   Genitourinary: Negative.   Musculoskeletal: Negative.   Skin: Negative.   Allergic/Immunologic: Negative.   Neurological: Negative.   Hematological: Negative.   Psychiatric/Behavioral: Negative.    Past Medical History:  Diagnosis Date  . Depression   . Hyperlipidemia   . Hypertension   . Thyroid disease     Social History   Socioeconomic History  . Marital status: Married    Spouse name: Not on file  . Number of children: Not on file  . Years of education: Not on file  . Highest education level: Not on file  Occupational History  . Not on file  Tobacco Use  . Smoking status: Never Smoker  . Smokeless tobacco: Never Used  Substance and Sexual Activity  . Alcohol use: No    Alcohol/week: 0.0 standard drinks  . Drug use: No  . Sexual activity: Not on file  Other Topics Concern  . Not on file  Social History Narrative  . Not on file   Social Determinants of Health   Financial Resource Strain:   . Difficulty of Paying Living Expenses: Not on file  Food Insecurity:   . Worried About Charity fundraiser in the Last Year: Not on file  . Ran Out of Food in the Last Year: Not on file  Transportation Needs:   . Lack of Transportation (Medical): Not on file  . Lack of Transportation (Non-Medical): Not on file  Physical Activity:   . Days of Exercise per Week: Not  on file  . Minutes of Exercise per Session: Not on file  Stress:   . Feeling of Stress : Not on file  Social Connections:   . Frequency of Communication with Friends and Family: Not on file  . Frequency of Social Gatherings with Friends and Family: Not on file  . Attends Religious Services: Not on file  . Active Member of Clubs or Organizations: Not on file  . Attends Archivist Meetings: Not on file  . Marital Status: Not on file  Intimate Partner Violence:   . Fear of  Current or Ex-Partner: Not on file  . Emotionally Abused: Not on file  . Physically Abused: Not on file  . Sexually Abused: Not on file    Past Surgical History:  Procedure Laterality Date  . APPENDECTOMY    . COLONOSCOPY    . HERNIA REPAIR      Family History  Problem Relation Age of Onset  . Hyperlipidemia Mother   . Heart disease Mother   . Macular degeneration Mother   . Dementia Mother   . Cancer Father        colon  . Colon cancer Father   . Colon cancer Maternal Grandfather   . Breast cancer Paternal Grandmother     Allergies  Allergen Reactions  . Erythromycin     REACTION: Rash  . Penicillins     REACTION: Hives  . Thyroid Hormones Hives    Armor Thyroid    Current Outpatient Medications on File Prior to Visit  Medication Sig Dispense Refill  . calcium carbonate (OS-CAL) 600 MG TABS Take 600 mg by mouth 2 (two) times daily with a meal.      . denosumab (PROLIA) 60 MG/ML SOLN injection Inject 60 mg into the skin every 6 (six) months. Administer in upper arm, thigh, or abdomen 1 mL 0  . hydrochlorothiazide (HYDRODIURIL) 25 MG tablet TAKE 1 TABLET DAILY 90 tablet 0  . levothyroxine (SYNTHROID) 150 MCG tablet TAKE 1 TABLET DAILY 90 tablet 0  . lisinopril (ZESTRIL) 5 MG tablet TAKE 1 TABLET DAILY 90 tablet 0  . Multiple Vitamin (MULTIVITAMIN) tablet Take 1 tablet by mouth daily.    . Multiple Vitamins-Minerals (OCUVITE ADULT 50+ PO) Take by mouth.    . triamcinolone ointment (KENALOG) 0.5 % APPLY 1 APPLICATION        TOPICALLY TWO TIMES A DAY 30 g 3  . TURMERIC PO Take by mouth.     No current facility-administered medications on file prior to visit.    BP (!) 150/88   Temp 98.2 F (36.8 C) (Temporal)   Ht 5\' 5"  (1.651 m)   Wt 277 lb (125.6 kg)   BMI 46.10 kg/m       Objective:   Physical Exam Vitals and nursing note reviewed.  Constitutional:      General: She is not in acute distress.    Appearance: Normal appearance. She is obese. She is not  diaphoretic.  HENT:     Head: Normocephalic and atraumatic.     Right Ear: Tympanic membrane, ear canal and external ear normal. There is no impacted cerumen.     Left Ear: Tympanic membrane, ear canal and external ear normal. There is no impacted cerumen.     Nose: Nose normal. No congestion or rhinorrhea.     Mouth/Throat:     Mouth: Mucous membranes are moist.     Pharynx: Oropharynx is clear. No oropharyngeal exudate or posterior oropharyngeal erythema.  Eyes:     General: No scleral icterus.       Right eye: No discharge.        Left eye: No discharge.     Conjunctiva/sclera: Conjunctivae normal.     Pupils: Pupils are equal, round, and reactive to light.  Neck:     Thyroid: No thyromegaly.     Vascular: No JVD.     Trachea: No tracheal deviation.  Cardiovascular:     Rate and Rhythm: Normal rate and regular rhythm.     Pulses: Normal pulses.     Heart sounds: Normal heart sounds. No murmur. No friction rub. No gallop.   Pulmonary:     Effort: Pulmonary effort is normal. No respiratory distress.     Breath sounds: Normal breath sounds. No stridor. No wheezing, rhonchi or rales.  Chest:     Chest wall: No tenderness.  Abdominal:     General: Bowel sounds are normal. There is no distension.     Palpations: Abdomen is soft. There is no mass.     Tenderness: There is no abdominal tenderness. There is no right CVA tenderness, left CVA tenderness, guarding or rebound.     Hernia: No hernia is present.  Musculoskeletal:        General: No swelling, tenderness, deformity or signs of injury. Normal range of motion.     Cervical back: Normal range of motion and neck supple.     Right lower leg: No edema.     Left lower leg: No edema.  Lymphadenopathy:     Cervical: No cervical adenopathy.  Skin:    General: Skin is warm and dry.     Capillary Refill: Capillary refill takes less than 2 seconds.     Coloration: Skin is not jaundiced or pale.     Findings: No bruising, erythema,  lesion or rash.  Neurological:     General: No focal deficit present.     Mental Status: She is alert and oriented to person, place, and time. Mental status is at baseline.     Cranial Nerves: No cranial nerve deficit.     Motor: No weakness or abnormal muscle tone.     Coordination: Coordination normal.     Gait: Gait normal.     Deep Tendon Reflexes: Reflexes normal.  Psychiatric:        Mood and Affect: Mood normal.        Behavior: Behavior normal.        Thought Content: Thought content normal.        Judgment: Judgment normal.       Assessment & Plan:  1. Essential hypertension - Will increase lisinopril  - Follow up in two weeks  -Encourage to monitor blood pressures at home. - CBC with Differential/Platelet - Comprehensive metabolic panel - Lipid panel - TSH - lisinopril (ZESTRIL) 20 MG tablet; Take 1 tablet (20 mg total) by mouth daily.  Dispense: 90 tablet; Refill: 3  2. Osteoporosis, unspecified osteoporosis type, unspecified pathological fracture presence - Continue with Prolia.  - Bone density screen in December 2021 - Vitamin D, 25-hydroxy  3. Hyperlipidemia, unspecified hyperlipidemia type - Consider statin  - CBC with Differential/Platelet - Comprehensive metabolic panel - Lipid panel - TSH  4. Hypothyroidism, unspecified type -Consider change in dose of Synthroid - CBC with Differential/Platelet - Comprehensive metabolic panel - Lipid panel - TSH  5. Vitamin D deficiency  - Vitamin D, 25-hydroxy  6. MORBID OBESITY -Encourage lifestyle modifications to help  lose weight. - CBC with Differential/Platelet - Comprehensive metabolic panel - Lipid panel - TSH - Vitamin D, 25-hydroxy  Dorothyann Peng, NP

## 2019-12-04 NOTE — Patient Instructions (Signed)
It was great seeing you today   We will follow up with you regarding your blood work   Please follow up in two weeks as we have changed the dose of your blood pressure medication   Please work on weight loss through diet and exercise

## 2019-12-07 MED ORDER — ROSUVASTATIN CALCIUM 20 MG PO TABS: 20.0000 mg | ORAL_TABLET | Freq: Every day | ORAL | 0 refills | Status: DC

## 2019-12-07 NOTE — Addendum Note (Signed)
Addended by: Lahoma Crocker A on: 12/07/2019 11:40 AM   Modules accepted: Orders

## 2019-12-17 ENCOUNTER — Other Ambulatory Visit: Payer: Self-pay

## 2019-12-18 ENCOUNTER — Encounter: Payer: Self-pay | Admitting: Adult Health

## 2019-12-18 ENCOUNTER — Ambulatory Visit (INDEPENDENT_AMBULATORY_CARE_PROVIDER_SITE_OTHER): Payer: Medicare Other | Admitting: Adult Health

## 2019-12-18 ENCOUNTER — Telehealth: Payer: Self-pay | Admitting: Family Medicine

## 2019-12-18 VITALS — BP 122/84 | Temp 98.0°F | Wt 275.0 lb

## 2019-12-18 DIAGNOSIS — I10 Essential (primary) hypertension: Secondary | ICD-10-CM

## 2019-12-18 NOTE — Progress Notes (Signed)
Subjective:    Patient ID: [name], female    DOB: [birth year], 69 y.o.   MRN: [id number]  HPI 69 year old female who  has a past medical history of Depression, Hyperlipidemia, Hypertension, and Thyroid disease.  She presents to the office today for follow-up regarding hypertension.  When she was seen approximately 2 weeks ago her blood pressure was elevated, at this time she was taking lisinopril 5 mg and hydrochlorothiazide 25 mg daily.  We increase lisinopril to 20 mg.  Since the increase she reports that she had one episode of lightheadedness on the first day that she increase the medication but since then she has not had no side effects.  BP Readings from Last 3 Encounters:  12/18/19 122/84  12/04/19 (!) 150/88  07/18/18 140/68    Review of Systems See HPI   Past Medical History:  Diagnosis Date  . Depression   . Hyperlipidemia   . Hypertension   . Thyroid disease     Social History   Socioeconomic History  . Marital status: Married    Spouse name: Not on file  . Number of children: Not on file  . Years of education: Not on file  . Highest education level: Not on file  Occupational History  . Not on file  Tobacco Use  . Smoking status: Never Smoker  . Smokeless tobacco: Never Used  Substance and Sexual Activity  . Alcohol use: No    Alcohol/week: 0.0 standard drinks  . Drug use: No  . Sexual activity: Not on file  Other Topics Concern  . Not on file  Social History Narrative  . Not on file   Social Determinants of Health   Financial Resource Strain:   . Difficulty of Paying Living Expenses:   Food Insecurity:   . Worried About Charity fundraiser in the Last Year:   . Arboriculturist in the Last Year:   Transportation Needs:   . Film/video editor (Medical):   Marland Kitchen Lack of Transportation (Non-Medical):   Physical Activity:   . Days of Exercise per Week:   . Minutes of Exercise per Session:   Stress:   . Feeling of Stress :   Social  Connections:   . Frequency of Communication with Friends and Family:   . Frequency of Social Gatherings with Friends and Family:   . Attends Religious Services:   . Active Member of Clubs or Organizations:   . Attends Archivist Meetings:   Marland Kitchen Marital Status:   Intimate Partner Violence:   . Fear of Current or Ex-Partner:   . Emotionally Abused:   Marland Kitchen Physically Abused:   . Sexually Abused:     Past Surgical History:  Procedure Laterality Date  . APPENDECTOMY    . COLONOSCOPY    . HERNIA REPAIR      Family History  Problem Relation Age of Onset  . Hyperlipidemia Mother   . Heart disease Mother   . Macular degeneration Mother   . Dementia Mother   . Cancer Father        colon  . Colon cancer Father   . Colon cancer Maternal Grandfather   . Breast cancer Paternal Grandmother     Allergies  Allergen Reactions  . Erythromycin     REACTION: Rash  . Penicillins     REACTION: Hives  . Thyroid Hormones Hives    Armor Thyroid    Current Outpatient Medications on File Prior to  Visit  Medication Sig Dispense Refill  . calcium carbonate (OS-CAL) 600 MG TABS Take 600 mg by mouth 2 (two) times daily with a meal.      . denosumab (PROLIA) 60 MG/ML SOLN injection Inject 60 mg into the skin every 6 (six) months. Administer in upper arm, thigh, or abdomen 1 mL 0  . hydrochlorothiazide (HYDRODIURIL) 25 MG tablet TAKE 1 TABLET DAILY 90 tablet 0  . levothyroxine (SYNTHROID) 150 MCG tablet TAKE 1 TABLET DAILY 90 tablet 0  . lisinopril (ZESTRIL) 20 MG tablet Take 1 tablet (20 mg total) by mouth daily. 90 tablet 3  . Multiple Vitamin (MULTIVITAMIN) tablet Take 1 tablet by mouth daily.    . Multiple Vitamins-Minerals (OCUVITE ADULT 50+ PO) Take by mouth.    . rosuvastatin (CRESTOR) 20 MG tablet Take 1 tablet (20 mg total) by mouth daily. 90 tablet 0  . triamcinolone ointment (KENALOG) 0.5 % APPLY 1 APPLICATION        TOPICALLY TWO TIMES A DAY 30 g 3  . TURMERIC PO Take by mouth.      No current facility-administered medications on file prior to visit.    BP 122/84   Temp 98 F (36.7 C)   Wt 275 lb (124.7 kg)   BMI 45.76 kg/m       Objective:   Physical Exam Vitals and nursing note reviewed.  Constitutional:      Appearance: Normal appearance. She is obese.  Cardiovascular:     Rate and Rhythm: Normal rate and regular rhythm.     Pulses: Normal pulses.     Heart sounds: Normal heart sounds.  Pulmonary:     Effort: Pulmonary effort is normal.     Breath sounds: Normal breath sounds.  Skin:    General: Skin is warm and dry.  Neurological:     General: No focal deficit present.     Mental Status: She is alert and oriented to person, place, and time.  Psychiatric:        Mood and Affect: Mood normal.        Behavior: Behavior normal.        Thought Content: Thought content normal.        Judgment: Judgment normal.       Assessment & Plan:  1. Essential hypertension -Blood pressure at goal.  Continue with current medication therapy.  Follow-up as needed

## 2019-12-18 NOTE — Telephone Encounter (Signed)
Tried submitting a PA on CoverMyMeds.  Received an automated response.  Additional Information Required This medication may be excluded from the patient's benefit. For more information, please reach out to [facility] directly at [phone]. Message from [facility]: Drug is not covered by plan

## 2019-12-23 NOTE — Telephone Encounter (Signed)
Received confirmation e-mail the information was received.

## 2019-12-23 NOTE — Telephone Encounter (Signed)
Tier exception placed in [employer] website.  Will wait for a determination.  Information printed and placed in Red folder.  Response will come in an e-mail.  [facility] e-mail given.

## 2019-12-25 ENCOUNTER — Other Ambulatory Visit: Payer: Self-pay | Admitting: Family Medicine

## 2019-12-25 MED ORDER — SYNTHROID 150 MCG PO TABS: 150.0000 ug | ORAL_TABLET | Freq: Every day | ORAL | 3 refills | Status: DC

## 2019-12-25 NOTE — Telephone Encounter (Signed)
RX SENT TO THE PHARMACY.

## 2019-12-25 NOTE — Telephone Encounter (Signed)
Received approval.  Tried to reach the pt to notify her but no answer or machine.

## 2019-12-25 NOTE — Telephone Encounter (Signed)
Pt notified that name brand Synthroid has been approved.

## 2020-01-05 ENCOUNTER — Other Ambulatory Visit: Payer: Self-pay | Admitting: Adult Health

## 2020-01-05 DIAGNOSIS — I10 Essential (primary) hypertension: Secondary | ICD-10-CM

## 2020-01-06 NOTE — Telephone Encounter (Signed)
Sent to the pharmacy by e-scribe. 

## 2020-01-18 ENCOUNTER — Telehealth: Payer: Self-pay | Admitting: Adult Health

## 2020-01-18 DIAGNOSIS — I10 Essential (primary) hypertension: Secondary | ICD-10-CM

## 2020-01-18 NOTE — Telephone Encounter (Signed)
[name] with [facility], is requesting a refill on Rosuvastatin 20 mg tablet and Lisinopril 20 mg tablet. Per Alda Berthold, pt is requesting a 90 day supply for both medications. Alda Berthold is aware that you are out of the office today and will return on 01/19/20. Thanks

## 2020-01-19 MED ORDER — ROSUVASTATIN CALCIUM 20 MG PO TABS: 20.0000 mg | ORAL_TABLET | Freq: Every day | ORAL | 3 refills | Status: DC

## 2020-01-19 MED ORDER — LISINOPRIL 20 MG PO TABS: 20.0000 mg | ORAL_TABLET | Freq: Every day | ORAL | 3 refills | Status: DC

## 2020-01-19 NOTE — Telephone Encounter (Signed)
Sent to the pharmacy by e-scribe. 

## 2020-01-19 NOTE — Addendum Note (Signed)
Addended by: Miles Costain T on: 01/19/2020 10:15 AM   Modules accepted: Orders

## 2020-03-24 ENCOUNTER — Telehealth: Payer: Self-pay | Admitting: *Deleted

## 2020-03-24 NOTE — Telephone Encounter (Signed)
Insurance verification started for Prolia 

## 2020-04-08 ENCOUNTER — Other Ambulatory Visit: Payer: Self-pay

## 2020-04-08 ENCOUNTER — Ambulatory Visit (INDEPENDENT_AMBULATORY_CARE_PROVIDER_SITE_OTHER): Payer: Medicare Other

## 2020-04-08 DIAGNOSIS — M810 Age-related osteoporosis without current pathological fracture: Secondary | ICD-10-CM

## 2020-04-08 MED ORDER — DENOSUMAB 60 MG/ML ~~LOC~~ SOSY
60.0000 mg | PREFILLED_SYRINGE | Freq: Once | SUBCUTANEOUS | Status: AC
  Administered 2020-04-08: 60 mg via SUBCUTANEOUS

## 2020-04-08 NOTE — Patient Instructions (Signed)
Health Maintenance Due  Topic Date Due  . COVID-19 Vaccine (1) Never done    Depression screen PHQ 2/9 07/10/2016  Decreased Interest 0  Down, Depressed, Hopeless 0  PHQ - 2 Score 0

## 2020-04-08 NOTE — Progress Notes (Signed)
Per orders of Dorothyann Peng NP, injection of Prolia given by Franco Collet. Patient tolerated injection well.

## 2020-04-20 NOTE — Telephone Encounter (Signed)
Patient received Prolia 04/08/20.

## 2020-06-10 ENCOUNTER — Encounter: Payer: Self-pay | Admitting: Gastroenterology

## 2020-07-19 ENCOUNTER — Ambulatory Visit
Admission: EM | Admit: 2020-07-19 | Discharge: 2020-07-19 | Disposition: A | Discharge status: Home / Self Care | Payer: Medicare Other | Attending: Family Medicine | Admitting: Family Medicine

## 2020-07-19 DIAGNOSIS — N3001 Acute cystitis with hematuria: Secondary | ICD-10-CM | POA: Insufficient documentation

## 2020-07-19 LAB — POCT URINALYSIS DIP (MANUAL ENTRY)
Bilirubin, UA: NEGATIVE
Glucose, UA: NEGATIVE mg/dL
Ketones, POC UA: NEGATIVE mg/dL
Leukocytes, UA: NEGATIVE
Nitrite, UA: POSITIVE — AB
Protein Ur, POC: NEGATIVE mg/dL
Spec Grav, UA: 1.01 (ref 1.010–1.025)
Urobilinogen, UA: 0.2 E.U./dL
pH, UA: 7.5 (ref 5.0–8.0)

## 2020-07-19 MED ORDER — DOXYCYCLINE HYCLATE 100 MG PO CAPS: 100.0000 mg | ORAL_CAPSULE | Freq: Two times a day (BID) | ORAL | 0 refills | Status: DC

## 2020-07-19 NOTE — ED Provider Notes (Signed)
[facility]    CSN: [id number] Arrival date & time: 07/19/20  0809      History   Chief Complaint Chief Complaint  Patient presents with  . Urinary Tract Infection    HPI [name] is a 69 y.o. female.   HPI  Patient presents for evaluation of a possible recurrent UTI.  Patient was seen at an urgent care in the [facility] on 07/11/2020 and diagnosed with a UTI treated with Macrobid.  In review of EMR urine grew out E. coli and was sensitive to the antibiotic she was prescribed.  However last night she reports experiencing dysuria in which she took a Pyridium and for pain.  Past Medical History:  Diagnosis Date  . Depression   . Hyperlipidemia   . Hypertension   . Thyroid disease     Patient Active Problem List   Diagnosis Date Noted  . Calcific bursitis 07/20/2016  . Degenerative arthritis of right knee 07/20/2016  . Rosacea, acne 08/12/2013  . MORBID OBESITY 07/03/2010  . OSTEOPOROSIS, LUMBAR SPINE 02/22/2009  . Hypothyroidism 05/13/2007  . INSOMNIA, TRANSIENT 05/13/2007  . Hyperlipidemia 04/29/2007  . Essential hypertension 04/29/2007    Past Surgical History:  Procedure Laterality Date  . APPENDECTOMY    . COLONOSCOPY    . HERNIA REPAIR      OB History    Gravida  6   Para  5   Term  5   Preterm      AB      Living        SAB      TAB      Ectopic      Multiple      Live Births               Home Medications    Prior to Admission medications   Medication Sig Start Date End Date Taking? Authorizing Provider  calcium carbonate (OS-CAL) 600 MG TABS Take 600 mg by mouth 2 (two) times daily with a meal.      [provider]  denosumab (PROLIA) 60 MG/ML SOLN injection Inject 60 mg into the skin every 6 (six) months. Administer in upper arm, thigh, or abdomen 10/24/17   Nafziger, Tommi Rumps, NP  hydrochlorothiazide (HYDRODIURIL) 25 MG tablet TAKE 1 TABLET DAILY 01/06/20   Nafziger, Tommi Rumps, NP  lisinopril (ZESTRIL) 20  MG tablet Take 1 tablet (20 mg total) by mouth daily. 01/19/20   Nafziger, Tommi Rumps, NP  Multiple Vitamin (MULTIVITAMIN) tablet Take 1 tablet by mouth daily.    [provider]  Multiple Vitamins-Minerals (OCUVITE ADULT 50+ PO) Take by mouth.    [provider]  rosuvastatin (CRESTOR) 20 MG tablet Take 1 tablet (20 mg total) by mouth daily. 01/19/20   Nafziger, Tommi Rumps, NP  SYNTHROID 150 MCG tablet Take 1 tablet (150 mcg total) by mouth daily before breakfast. 12/25/19   Nafziger, Tommi Rumps, NP  triamcinolone ointment (KENALOG) 0.5 % APPLY 1 APPLICATION        TOPICALLY TWO TIMES A DAY 05/01/19   Nafziger, Tommi Rumps, NP  TURMERIC PO Take by mouth.    [provider]    Family History Family History  Problem Relation Age of Onset  . Hyperlipidemia Mother   . Heart disease Mother   . Macular degeneration Mother   . Dementia Mother   . Colon cancer Father   . Colon cancer Maternal Grandfather   . Breast cancer Paternal Grandmother     Social  History Social History   Tobacco Use  . Smoking status: Never Smoker  . Smokeless tobacco: Never Used  Substance Use Topics  . Alcohol use: No    Alcohol/week: 0.0 standard drinks  . Drug use: No     Allergies   Erythromycin, Penicillins, and Thyroid hormones   Review of Systems Review of Systems Pertinent negatives listed in HPI   Physical Exam Triage Vital Signs ED Triage Vitals [07/19/20 0835]  Enc Vitals Group     BP 137/83     Pulse Rate 78     Resp 18     Temp 97.9 F (36.6 C)     Temp Source Oral     SpO2 97 %     Weight      Height      Head Circumference      Peak Flow      Pain Score 0     Pain Loc      Pain Edu?      Excl. in [place]?    No data found.  Updated Vital Signs BP 137/83 (BP Location: Left Arm)   Pulse 78   Temp 97.9 F (36.6 C) (Oral)   Resp 18   SpO2 97%   Visual Acuity Right Eye Distance:   Left Eye Distance:   Bilateral Distance:    Right Eye Near:   Left Eye Near:     Bilateral Near:     Physical Exam   UC Treatments / Results  Labs (all labs ordered are listed, but only abnormal results are displayed) Labs Reviewed  POCT URINALYSIS DIP (MANUAL ENTRY) - Abnormal; Notable for the following components:      Result Value   Blood, UA trace-intact (*)    Nitrite, UA Positive (*)    All other components within normal limits    EKG   Radiology No results found.  Procedures Procedures (including critical care time)  Medications Ordered in UC Medications - No data to display  Initial Impression / Assessment and Plan / UC Course  I have reviewed the triage vital signs and the nursing notes.  Pertinent labs & imaging results that were available during my care of the patient were reviewed by me and considered in my medical decision making (see chart for details).    Treated for an acute unresolved urinary tract infection.  Patient recently treated with 5 days of Macrobid per culture results available on EMR bacteria grown was sensitive to [place] however patient remains symptomatic therefore will change antibiotics to doxycycline 100 mg twice daily for total of 10 days.  Patient advised if symptoms do not resolve with current medication or worsens while on current medication to follow-up with primary care.  Urine is being cultured patient took an Azo prior to providing urine sample which likely contaminated UA results.   Final Clinical Impressions(s) / UC Diagnoses   Final diagnoses:  Acute cystitis with hematuria     Discharge Instructions     I am treating you with a course of doxycycline twice daily for 10 days.  If symptoms worsen while taking antibiotics or do not completely clear infection follow-up with your primary care provider as this would indicate a need to follow-up with a urologist.    ED Prescriptions    Medication Sig Dispense Auth. Provider   doxycycline (VIBRAMYCIN) 100 MG capsule Take 1 capsule (100 mg total) by mouth  2 (two) times daily. 20 capsule Scot Jun, FNP  PDMP not reviewed this encounter.   Scot Jun, [place] 07/19/20 [phone]

## 2020-07-19 NOTE — Discharge Instructions (Addendum)
I am treating you with a course of doxycycline twice daily for 10 days.  If symptoms worsen while taking antibiotics or do not completely clear infection follow-up with your primary care provider as this would indicate a need to follow-up with a urologist.

## 2020-07-19 NOTE — ED Triage Notes (Signed)
Pt states tx'd for UTI last Monday, completed round of nitrofurantoin and pyridium on Friday. States last night started back with burning on urination with urgency and frequency. States took a pyridium last night with relief.

## 2020-07-20 LAB — URINE CULTURE
Culture: NO GROWTH
Special Requests: NORMAL

## 2020-10-11 ENCOUNTER — Ambulatory Visit (INDEPENDENT_AMBULATORY_CARE_PROVIDER_SITE_OTHER): Payer: Medicare Other | Admitting: Family Medicine

## 2020-10-11 ENCOUNTER — Other Ambulatory Visit: Payer: Self-pay

## 2020-10-11 DIAGNOSIS — Z23 Encounter for immunization: Secondary | ICD-10-CM | POA: Diagnosis not present

## 2020-10-14 ENCOUNTER — Telehealth: Payer: Self-pay | Admitting: *Deleted

## 2020-10-14 NOTE — Telephone Encounter (Signed)
Patient has been approved for Prolia.  Patient is aware the cost is about $250.  Nurse visit scheduled for 10/17/20.

## 2020-10-17 ENCOUNTER — Other Ambulatory Visit: Payer: Self-pay

## 2020-10-17 ENCOUNTER — Ambulatory Visit (INDEPENDENT_AMBULATORY_CARE_PROVIDER_SITE_OTHER): Payer: Medicare Other

## 2020-10-17 DIAGNOSIS — M810 Age-related osteoporosis without current pathological fracture: Secondary | ICD-10-CM | POA: Diagnosis not present

## 2020-10-17 MED ORDER — DENOSUMAB 60 MG/ML ~~LOC~~ SOSY
60.0000 mg | PREFILLED_SYRINGE | Freq: Once | SUBCUTANEOUS | Status: AC
  Administered 2020-10-17: 60 mg via SUBCUTANEOUS

## 2020-10-17 NOTE — Progress Notes (Signed)
Pt came in for her prolia injection. Injection given sub q on the left arm. Pt tolerated injection well.

## 2020-10-18 ENCOUNTER — Other Ambulatory Visit: Payer: Self-pay | Admitting: Family Medicine

## 2020-10-18 DIAGNOSIS — I10 Essential (primary) hypertension: Secondary | ICD-10-CM

## 2020-10-18 MED ORDER — ROSUVASTATIN CALCIUM 20 MG PO TABS: 20.0000 mg | ORAL_TABLET | Freq: Every day | ORAL | 0 refills | Status: DC

## 2020-10-18 MED ORDER — HYDROCHLOROTHIAZIDE 25 MG PO TABS: 25.0000 mg | ORAL_TABLET | Freq: Every day | ORAL | 0 refills | Status: DC

## 2020-10-18 MED ORDER — LISINOPRIL 20 MG PO TABS: 20.0000 mg | ORAL_TABLET | Freq: Every day | ORAL | 0 refills | Status: DC

## 2020-10-18 NOTE — Addendum Note (Signed)
Addended by: Miles Costain T on: 10/18/2020 11:52 AM   Modules accepted: Orders

## 2020-10-18 NOTE — Telephone Encounter (Signed)
Sent April 2021 to [facility] for 1 year.  Now getting rx from [facility].  Sent for 90 days.  Pt due for cpx in April 2022.

## 2020-11-18 ENCOUNTER — Encounter: Payer: Self-pay | Admitting: Gastroenterology

## 2020-12-08 ENCOUNTER — Other Ambulatory Visit: Payer: Self-pay | Admitting: Adult Health

## 2020-12-08 DIAGNOSIS — I10 Essential (primary) hypertension: Secondary | ICD-10-CM

## 2021-01-04 ENCOUNTER — Other Ambulatory Visit: Payer: Self-pay | Admitting: Adult Health

## 2021-01-04 DIAGNOSIS — I10 Essential (primary) hypertension: Secondary | ICD-10-CM

## 2021-01-04 NOTE — Telephone Encounter (Signed)
Last office visit-12/18/2019 Last labs- 12/04/2019  No future appointment has been scheduled.   Can this patient receive a courtesy refill?   Will reach out to schedule appointment

## 2021-01-04 NOTE — Telephone Encounter (Signed)
[facility] for 90 days of each medication. Will need CPE for future refills though

## 2021-01-06 NOTE — Telephone Encounter (Signed)
CPE is scheduled for 02/09/2021.  Refills sent to [facility]

## 2021-02-09 ENCOUNTER — Encounter: Payer: 59 | Admitting: Adult Health

## 2021-02-20 ENCOUNTER — Other Ambulatory Visit: Payer: Self-pay | Admitting: Adult Health

## 2021-03-10 ENCOUNTER — Telehealth: Payer: Self-pay

## 2021-03-10 NOTE — Telephone Encounter (Signed)
Patient is due for Prolia injection on 04/17/21. Verification process started today.

## 2021-03-14 ENCOUNTER — Other Ambulatory Visit: Payer: Self-pay

## 2021-03-14 ENCOUNTER — Encounter: Payer: Self-pay | Admitting: Adult Health

## 2021-03-14 ENCOUNTER — Ambulatory Visit (INDEPENDENT_AMBULATORY_CARE_PROVIDER_SITE_OTHER): Payer: Medicare Other | Admitting: Adult Health

## 2021-03-14 VITALS — BP 112/70 | HR 74 | Temp 98.2°F | Ht 65.0 in | Wt 250.0 lb

## 2021-03-14 DIAGNOSIS — E039 Hypothyroidism, unspecified: Secondary | ICD-10-CM | POA: Diagnosis not present

## 2021-03-14 DIAGNOSIS — E785 Hyperlipidemia, unspecified: Secondary | ICD-10-CM | POA: Diagnosis not present

## 2021-03-14 DIAGNOSIS — Z0000 Encounter for general adult medical examination without abnormal findings: Secondary | ICD-10-CM

## 2021-03-14 DIAGNOSIS — Z1211 Encounter for screening for malignant neoplasm of colon: Secondary | ICD-10-CM

## 2021-03-14 DIAGNOSIS — E559 Vitamin D deficiency, unspecified: Secondary | ICD-10-CM

## 2021-03-14 DIAGNOSIS — I10 Essential (primary) hypertension: Secondary | ICD-10-CM

## 2021-03-14 DIAGNOSIS — M810 Age-related osteoporosis without current pathological fracture: Secondary | ICD-10-CM

## 2021-03-14 LAB — COMPREHENSIVE METABOLIC PANEL
ALT: 43 U/L — ABNORMAL HIGH (ref 0–35)
AST: 35 U/L (ref 0–37)
Albumin: 4.1 g/dL (ref 3.5–5.2)
Alkaline Phosphatase: 65 U/L (ref 39–117)
BUN: 18 mg/dL (ref 6–23)
CO2: 29 mEq/L (ref 19–32)
Calcium: 9.8 mg/dL (ref 8.4–10.5)
Chloride: 103 mEq/L (ref 96–112)
Creatinine, Ser: 0.74 mg/dL (ref 0.40–1.20)
GFR: 82.63 mL/min (ref >60.00)
Glucose, Bld: 100 mg/dL — ABNORMAL HIGH (ref 70–99)
Potassium: 4.3 mEq/L (ref 3.5–5.1)
Sodium: 142 mEq/L (ref 135–145)
Total Bilirubin: 0.7 mg/dL (ref 0.2–1.2)
Total Protein: 6.9 g/dL (ref 6.0–8.3)

## 2021-03-14 LAB — URINALYSIS
Bilirubin Urine: NEGATIVE
Hgb urine dipstick: NEGATIVE
Ketones, ur: NEGATIVE
Leukocytes,Ua: NEGATIVE
Nitrite: NEGATIVE
Specific Gravity, Urine: 1.015 (ref 1.000–1.030)
Total Protein, Urine: NEGATIVE
Urine Glucose: NEGATIVE
Urobilinogen, UA: 0.2 (ref 0.0–1.0)
pH: 7.5 (ref 5.0–8.0)

## 2021-03-14 LAB — CBC WITH DIFFERENTIAL/PLATELET
Basophils Absolute: 0 10*3/uL (ref 0.0–0.1)
Basophils Relative: 0.5 % (ref 0.0–3.0)
Eosinophils Absolute: 0.2 10*3/uL (ref 0.0–0.7)
Eosinophils Relative: 3.6 % (ref 0.0–5.0)
HCT: 41.8 % (ref 36.0–46.0)
Hemoglobin: 14 g/dL (ref 12.0–15.0)
Lymphocytes Relative: 33.5 % (ref 12.0–46.0)
Lymphs Abs: 1.6 10*3/uL (ref 0.7–4.0)
MCHC: 33.6 g/dL (ref 30.0–36.0)
MCV: 89 fl (ref 78.0–100.0)
Monocytes Absolute: 0.6 10*3/uL (ref 0.1–1.0)
Monocytes Relative: 12.1 % — ABNORMAL HIGH (ref 3.0–12.0)
Neutro Abs: 2.4 10*3/uL (ref 1.4–7.7)
Neutrophils Relative %: 50.3 % (ref 43.0–77.0)
Platelets: 282 10*3/uL (ref 150.0–400.0)
RBC: 4.69 Mil/uL (ref 3.87–5.11)
RDW: 13.6 % (ref 11.5–15.5)
WBC: 4.9 10*3/uL (ref 4.0–10.5)

## 2021-03-14 LAB — LIPID PANEL
Cholesterol: 122 mg/dL (ref 0–200)
HDL: 50.6 mg/dL (ref >39.00)
LDL Cholesterol: 50 mg/dL (ref 0–99)
NonHDL: 71.52
Total CHOL/HDL Ratio: 2
Triglycerides: 110 mg/dL (ref 0.0–149.0)
VLDL: 22 mg/dL (ref 0.0–40.0)

## 2021-03-14 LAB — TSH: TSH: 0.05 u[IU]/mL — ABNORMAL LOW (ref 0.35–4.50)

## 2021-03-14 LAB — VITAMIN D 25 HYDROXY (VIT D DEFICIENCY, FRACTURES): VITD: 61.95 ng/mL (ref 30.00–100.00)

## 2021-03-14 NOTE — Patient Instructions (Addendum)
It was great seeing you today   We will follow up with you regarding your labs   Please schedule your colonoscopy, bone density screen and mammogram   I will see you back in one year or sooner if needed

## 2021-03-14 NOTE — Progress Notes (Signed)
Subjective:    Patient ID: NASIRA JANUSZ, female    DOB: 08/19/1951, 70 y.o.   MRN: [id number]  HPI  Patient presents for yearly preventative medicine examination. She is a pleasant 70 year old female who  has a past medical history of Depression, Hyperlipidemia, Hypertension, and Thyroid disease.  Essential Hypertension -well-controlled with lisinopril 20 mg and HCTZ 25 mg daily.  She denies dizziness, lightheadedness, chest pain, or shortness of breath BP Readings from Last 3 Encounters:  03/14/21 112/70  07/19/20 137/83  12/18/19 122/84   Hypothyroidism - takes Synthroid 119mcg daily.   Osteoporosis - does Prolia injections. Last Dexa scan in 09/2018.The BMD measured at [facility] L2-L3 is 0.904 g/cm2 with a T-score of -2.5.  Hyperlipidemia - takes Crestor 20 mg daily. Denies myalgia or fatigue  Lab Results  Component Value Date   CHOL 222 (H) 12/04/2019   HDL 52.30 12/04/2019   LDLCALC 151 (H) 12/04/2019   LDLDIRECT 114.8 06/23/2009   TRIG 92.0 12/04/2019   CHOLHDL 4 12/04/2019   Covid 19 Infection - infected in August 2021 - likely suffering from long covid with fatigue.   All immunizations and health maintenance protocols were reviewed with the patient and needed orders were placed.  Appropriate screening laboratory values were ordered for the patient including screening of hyperlipidemia, renal function and hepatic function.   Medication reconciliation,  past medical history, social history, problem list and allergies were reviewed in detail with the patient  Goals were established with regard to weight loss, exercise, and  diet in compliance with medications  Wt Readings from Last 3 Encounters:  03/14/21 250 lb (113.4 kg)  12/18/19 275 lb (124.7 kg)  12/04/19 277 lb (125.6 kg)   Is overdue for colon cancer screening.  Her last was in 2016 and she is on the 5-year plan due to polyps.  Review of Systems  Constitutional: Positive for fatigue.  HENT: Negative.    Eyes: Negative.   Respiratory: Negative.   Cardiovascular: Negative.   Gastrointestinal: Negative.   Endocrine: Negative.   Genitourinary: Negative.   Musculoskeletal: Negative.   Skin: Negative.   Allergic/Immunologic: Negative.   Neurological: Negative.   Hematological: Negative.   Psychiatric/Behavioral: Negative.    Past Medical History:  Diagnosis Date  . Depression   . Hyperlipidemia   . Hypertension   . Thyroid disease     Social History   Socioeconomic History  . Marital status: Married    Spouse name: Not on file  . Number of children: Not on file  . Years of education: Not on file  . Highest education level: Not on file  Occupational History  . Not on file  Tobacco Use  . Smoking status: Never Smoker  . Smokeless tobacco: Never Used  Substance and Sexual Activity  . Alcohol use: No    Alcohol/week: 0.0 standard drinks  . Drug use: No  . Sexual activity: Not on file  Other Topics Concern  . Not on file  Social History Narrative  . Not on file   Social Determinants of Health   Financial Resource Strain: Not on file  Food Insecurity: Not on file  Transportation Needs: Not on file  Physical Activity: Not on file  Stress: Not on file  Social Connections: Not on file  Intimate Partner Violence: Not on file    Past Surgical History:  Procedure Laterality Date  . APPENDECTOMY    . COLONOSCOPY    . HERNIA REPAIR  Family History  Problem Relation Age of Onset  . Hyperlipidemia Mother   . Heart disease Mother   . Macular degeneration Mother   . Dementia Mother   . Colon cancer Father   . Colon cancer Maternal Grandfather   . Breast cancer Paternal Grandmother     Allergies  Allergen Reactions  . Erythromycin     REACTION: Rash  . Penicillins     REACTION: Hives  . Thyroid Hormones Hives    Armor Thyroid    Current Outpatient Medications on File Prior to Visit  Medication Sig Dispense Refill  . calcium carbonate (OS-CAL) 600 MG  TABS Take 600 mg by mouth 2 (two) times daily with a meal.    . denosumab (PROLIA) 60 MG/ML SOLN injection Inject 60 mg into the skin every 6 (six) months. Administer in upper arm, thigh, or abdomen 1 mL 0  . hydrochlorothiazide (HYDRODIURIL) 25 MG tablet TAKE 1 TABLET DAILY 90 tablet 0  . lisinopril (ZESTRIL) 20 MG tablet TAKE 1 TABLET DAILY 90 tablet 0  . Multiple Vitamin (MULTIVITAMIN) tablet Take 1 tablet by mouth daily.    . Multiple Vitamins-Minerals (OCUVITE ADULT 50+ PO) Take by mouth.    . rosuvastatin (CRESTOR) 20 MG tablet TAKE 1 TABLET DAILY 90 tablet 0  . SYNTHROID 150 MCG tablet TAKE 1 TABLET BY MOUTH DAILY BEFORE BREAKFAST. 90 tablet 3  . triamcinolone ointment (KENALOG) 0.5 % APPLY 1 APPLICATION        TOPICALLY TWO TIMES A DAY 30 g 3  . TURMERIC PO Take by mouth.     No current facility-administered medications on file prior to visit.    BP 112/70   Pulse 74   Temp 98.2 F (36.8 C) (Oral)   Ht 5\' 5"  (1.651 m)   Wt 250 lb (113.4 kg)   SpO2 99%   BMI 41.60 kg/m       Objective:   Physical Exam Vitals and nursing note reviewed.  Constitutional:      General: She is not in acute distress.    Appearance: Normal appearance. She is well-developed. She is obese. She is not ill-appearing.  HENT:     Head: Normocephalic and atraumatic.     Right Ear: Tympanic membrane, ear canal and external ear normal. There is no impacted cerumen.     Left Ear: Tympanic membrane, ear canal and external ear normal. There is no impacted cerumen.     Nose: Nose normal. No congestion or rhinorrhea.     Mouth/Throat:     Mouth: Mucous membranes are moist.     Pharynx: Oropharynx is clear. No oropharyngeal exudate or posterior oropharyngeal erythema.  Eyes:     General:        Right eye: No discharge.        Left eye: No discharge.     Extraocular Movements: Extraocular movements intact.     Conjunctiva/sclera: Conjunctivae normal.     Pupils: Pupils are equal, round, and reactive to  light.  Neck:     Thyroid: No thyromegaly.     Vascular: No carotid bruit.     Trachea: No tracheal deviation.  Cardiovascular:     Rate and Rhythm: Normal rate and regular rhythm.     Pulses: Normal pulses.     Heart sounds: Normal heart sounds. No murmur heard. No friction rub. No gallop.   Pulmonary:     Effort: Pulmonary effort is normal. No respiratory distress.     Breath sounds:  Normal breath sounds. No stridor. No wheezing, rhonchi or rales.  Chest:     Chest wall: No tenderness.  Abdominal:     General: Abdomen is flat. Bowel sounds are normal. There is no distension.     Palpations: Abdomen is soft. There is no mass.     Tenderness: There is no abdominal tenderness. There is no right CVA tenderness, left CVA tenderness, guarding or rebound.     Hernia: No hernia is present.  Musculoskeletal:        General: No swelling, tenderness, deformity or signs of injury. Normal range of motion.     Cervical back: Normal range of motion and neck supple.     Right lower leg: No edema.     Left lower leg: No edema.  Lymphadenopathy:     Cervical: No cervical adenopathy.  Skin:    General: Skin is warm and dry.     Coloration: Skin is not jaundiced or pale.     Findings: No bruising, erythema, lesion or rash.  Neurological:     General: No focal deficit present.     Mental Status: She is alert and oriented to person, place, and time.     Cranial Nerves: No cranial nerve deficit.     Sensory: No sensory deficit.     Motor: No weakness.     Coordination: Coordination normal.     Gait: Gait normal.     Deep Tendon Reflexes: Reflexes normal.  Psychiatric:        Mood and Affect: Mood normal.        Behavior: Behavior normal.        Thought Content: Thought content normal.        Judgment: Judgment normal.       Assessment & Plan:  1. Routine general medical examination at a health care facility - Continue with weight loss measures - Follow up in one year or sooner if  needed - CBC with Differential/Platelet; Future - Comprehensive metabolic panel; Future - Lipid panel; Future - TSH; Future - Urinalysis; Future  2. Osteoporosis, unspecified osteoporosis type, unspecified pathological fracture presence  - Vitamin D, 25-hydroxy; Future - DG Bone Density; Future  3. Essential hypertension - Well controlled.  - No change in medications  - CBC with Differential/Platelet; Future - Comprehensive metabolic panel; Future - Lipid panel; Future - TSH; Future  4. Vitamin D deficiency  - Vitamin D, 25-hydroxy; Future  5. Hypothyroidism, unspecified type - Consider increase in synthroid  - CBC with Differential/Platelet; Future - Comprehensive metabolic panel; Future - Lipid panel; Future - TSH; Future  6. Hyperlipidemia, unspecified hyperlipidemia type - Consider increase in statin  - CBC with Differential/Platelet; Future - Comprehensive metabolic panel; Future - Lipid panel; Future - TSH; Future  7. Colon cancer screening - Schedule colonoscopy   Dorothyann Peng, NP

## 2021-03-14 NOTE — Addendum Note (Signed)
Addended by: Elmer Picker on: 03/14/2021 08:38 AM   Modules accepted: Orders

## 2021-03-15 ENCOUNTER — Other Ambulatory Visit: Payer: Self-pay | Admitting: Adult Health

## 2021-03-15 DIAGNOSIS — E039 Hypothyroidism, unspecified: Secondary | ICD-10-CM

## 2021-03-15 DIAGNOSIS — I10 Essential (primary) hypertension: Secondary | ICD-10-CM

## 2021-03-15 MED ORDER — ROSUVASTATIN CALCIUM 20 MG PO TABS: 20.0000 mg | ORAL_TABLET | Freq: Every day | ORAL | 3 refills | Status: DC

## 2021-03-15 MED ORDER — LEVOTHYROXINE SODIUM 137 MCG PO TABS: 137.0000 ug | ORAL_TABLET | Freq: Every day | ORAL | 1 refills | Status: DC

## 2021-03-15 MED ORDER — LISINOPRIL 20 MG PO TABS: 1.0000 | ORAL_TABLET | Freq: Every day | ORAL | 3 refills | Status: DC

## 2021-03-15 MED ORDER — HYDROCHLOROTHIAZIDE 25 MG PO TABS: 1.0000 | ORAL_TABLET | Freq: Every day | ORAL | 3 refills | Status: DC

## 2021-03-27 ENCOUNTER — Inpatient Hospital Stay: Admission: RE | Admit: 2021-03-27 | Payer: 59 | Source: Ambulatory Visit

## 2021-04-03 ENCOUNTER — Other Ambulatory Visit: Payer: 59

## 2021-04-05 ENCOUNTER — Other Ambulatory Visit: Payer: Self-pay | Admitting: Adult Health

## 2021-04-05 DIAGNOSIS — I10 Essential (primary) hypertension: Secondary | ICD-10-CM

## 2021-04-06 ENCOUNTER — Other Ambulatory Visit: Payer: Self-pay | Admitting: Adult Health

## 2021-04-06 DIAGNOSIS — Z1231 Encounter for screening mammogram for malignant neoplasm of breast: Secondary | ICD-10-CM

## 2021-04-07 ENCOUNTER — Encounter: Payer: Self-pay | Admitting: Adult Health

## 2021-04-17 ENCOUNTER — Other Ambulatory Visit (INDEPENDENT_AMBULATORY_CARE_PROVIDER_SITE_OTHER): Payer: Medicare Other

## 2021-04-17 ENCOUNTER — Other Ambulatory Visit: Payer: Self-pay

## 2021-04-17 ENCOUNTER — Ambulatory Visit (INDEPENDENT_AMBULATORY_CARE_PROVIDER_SITE_OTHER): Payer: Medicare Other

## 2021-04-17 DIAGNOSIS — M810 Age-related osteoporosis without current pathological fracture: Secondary | ICD-10-CM

## 2021-04-17 DIAGNOSIS — E039 Hypothyroidism, unspecified: Secondary | ICD-10-CM | POA: Diagnosis not present

## 2021-04-17 LAB — TSH: TSH: 0.49 u[IU]/mL (ref 0.35–5.50)

## 2021-04-17 MED ORDER — DENOSUMAB 60 MG/ML ~~LOC~~ SOSY
60.0000 mg | PREFILLED_SYRINGE | Freq: Once | SUBCUTANEOUS | Status: AC
  Administered 2021-04-17: 60 mg via SUBCUTANEOUS

## 2021-04-17 NOTE — Progress Notes (Signed)
Per orders of Dr. Jordan, injection of Prolia given by Dawne Casali E Nikala Walsworth. Patient tolerated injection well.  

## 2021-04-18 ENCOUNTER — Other Ambulatory Visit: Payer: Self-pay | Admitting: Adult Health

## 2021-04-18 DIAGNOSIS — E039 Hypothyroidism, unspecified: Secondary | ICD-10-CM

## 2021-04-18 MED ORDER — LEVOTHYROXINE SODIUM 137 MCG PO TABS
137.0000 ug | ORAL_TABLET | Freq: Every day | ORAL | 3 refills | Status: DC
Start: 2021-04-18 — End: 2022-05-04

## 2021-05-05 ENCOUNTER — Other Ambulatory Visit: Payer: Self-pay

## 2021-05-05 ENCOUNTER — Ambulatory Visit (INDEPENDENT_AMBULATORY_CARE_PROVIDER_SITE_OTHER)
Admission: RE | Admit: 2021-05-05 | Discharge: 2021-05-05 | Disposition: A | Discharge status: Home / Self Care | Payer: Medicare Other | Source: Ambulatory Visit | Attending: Adult Health | Admitting: Adult Health

## 2021-05-05 DIAGNOSIS — M810 Age-related osteoporosis without current pathological fracture: Secondary | ICD-10-CM

## 2021-05-19 ENCOUNTER — Other Ambulatory Visit: Payer: Self-pay

## 2021-05-19 ENCOUNTER — Ambulatory Visit
Admission: RE | Admit: 2021-05-19 | Discharge: 2021-05-19 | Disposition: A | Discharge status: Home / Self Care | Payer: Medicare Other | Source: Ambulatory Visit

## 2021-05-19 DIAGNOSIS — Z1231 Encounter for screening mammogram for malignant neoplasm of breast: Secondary | ICD-10-CM

## 2021-06-26 ENCOUNTER — Telehealth: Payer: Self-pay | Admitting: Adult Health

## 2021-06-26 NOTE — Telephone Encounter (Signed)
Left message for patient to call back and schedule Medicare Annual Wellness Visit (AWV) either virtually or in office. Left  my jabber number [phone]   awvi 05/08/20 per [facility]   please schedule at anytime with [facility] Nurse Health Advisor 1 or 2   This should be a 45 minute visit.  

## 2021-07-03 ENCOUNTER — Other Ambulatory Visit: Payer: Self-pay | Admitting: Adult Health

## 2021-07-03 DIAGNOSIS — I10 Essential (primary) hypertension: Secondary | ICD-10-CM

## 2021-07-10 ENCOUNTER — Other Ambulatory Visit: Payer: Self-pay

## 2021-07-10 ENCOUNTER — Ambulatory Visit (INDEPENDENT_AMBULATORY_CARE_PROVIDER_SITE_OTHER): Payer: Medicare Other

## 2021-07-10 VITALS — BP 122/74 | HR 72 | Temp 98.2°F | Ht 66.0 in | Wt 254.0 lb

## 2021-07-10 DIAGNOSIS — Z1211 Encounter for screening for malignant neoplasm of colon: Secondary | ICD-10-CM

## 2021-07-10 DIAGNOSIS — Z0000 Encounter for general adult medical examination without abnormal findings: Secondary | ICD-10-CM | POA: Diagnosis not present

## 2021-07-10 NOTE — Patient Instructions (Signed)
[name] , Thank you for taking time to come for your Medicare Wellness Visit. I appreciate your ongoing commitment to your health goals. Please review the following plan we discussed and let me know if I can assist you in the future.   Screening recommendations/referrals: Colonoscopy: referral 07/10/2021 Mammogram: 05/19/2021 Bone Density: 05/05/2021 Recommended yearly ophthalmology/optometry visit for glaucoma screening and checkup Recommended yearly dental visit for hygiene and checkup  Vaccinations: Influenza vaccine: due in fall 2022  Pneumococcal vaccine: completed series  Tdap vaccine: 07/25/2022 Shingles vaccine: declined     Advanced directives: none   Conditions/risks identified: none   Next appointment: none    Preventive Care 25 Years and Older, Female Preventive care refers to lifestyle choices and visits with your health care provider that can promote health and wellness. What does preventive care include? A yearly physical exam. This is also called an annual well check. Dental exams once or twice a year. Routine eye exams. Ask your health care provider how often you should have your eyes checked. Personal lifestyle choices, including: Daily care of your teeth and gums. Regular physical activity. Eating a healthy diet. Avoiding tobacco and drug use. Limiting alcohol use. Practicing safe sex. Taking low-dose aspirin every day. Taking vitamin and mineral supplements as recommended by your health care provider. What happens during an annual well check? The services and screenings done by your health care provider during your annual well check will depend on your age, overall health, lifestyle risk factors, and family history of disease. Counseling  Your health care provider may ask you questions about your: Alcohol use. Tobacco use. Drug use. Emotional well-being. Home and relationship well-being. Sexual activity. Eating habits. History of falls. Memory  and ability to understand (cognition). Work and work Statistician. Reproductive health. Screening  You may have the following tests or measurements: Height, weight, and BMI. Blood pressure. Lipid and cholesterol levels. These may be checked every 5 years, or more frequently if you are over 60 years old. Skin check. Lung cancer screening. You may have this screening every year starting at age 53 if you have a 30-pack-year history of smoking and currently smoke or have quit within the past 15 years. Fecal occult blood test (FOBT) of the stool. You may have this test every year starting at age 72. Flexible sigmoidoscopy or colonoscopy. You may have a sigmoidoscopy every 5 years or a colonoscopy every 10 years starting at age 80. Hepatitis C blood test. Hepatitis B blood test. Sexually transmitted disease (STD) testing. Diabetes screening. This is done by checking your blood sugar (glucose) after you have not eaten for a while (fasting). You may have this done every 1-3 years. Bone density scan. This is done to screen for osteoporosis. You may have this done starting at age 40. Mammogram. This may be done every 1-2 years. Talk to your health care provider about how often you should have regular mammograms. Talk with your health care provider about your test results, treatment options, and if necessary, the need for more tests. Vaccines  Your health care provider may recommend certain vaccines, such as: Influenza vaccine. This is recommended every year. Tetanus, diphtheria, and acellular pertussis (Tdap, Td) vaccine. You may need a Td booster every 10 years. Zoster vaccine. You may need this after age 37. Pneumococcal 13-valent conjugate (PCV13) vaccine. One dose is recommended after age 50. Pneumococcal polysaccharide (PPSV23) vaccine. One dose is recommended after age 39. Talk to your health care provider about which screenings and vaccines you  need and how often you need them. This  information is not intended to replace advice given to you by your health care provider. Make sure you discuss any questions you have with your health care provider. Document Released: 10/21/2015 Document Revised: 06/13/2016 Document Reviewed: 07/26/2015 Elsevier Interactive Patient Education  2017 [place] Prevention in the Home Falls can cause injuries. They can happen to people of all ages. There are many things you can do to make your home safe and to help prevent falls. What can I do on the outside of my home? Regularly fix the edges of walkways and driveways and fix any cracks. Remove anything that might make you trip as you walk through a door, such as a raised step or threshold. Trim any bushes or trees on the path to your home. Use bright outdoor lighting. Clear any walking paths of anything that might make someone trip, such as rocks or tools. Regularly check to see if handrails are loose or broken. Make sure that both sides of any steps have handrails. Any raised decks and porches should have guardrails on the edges. Have any leaves, snow, or ice cleared regularly. Use sand or salt on walking paths during winter. Clean up any spills in your garage right away. This includes oil or grease spills. What can I do in the bathroom? Use night lights. Install grab bars by the toilet and in the tub and shower. Do not use towel bars as grab bars. Use non-skid mats or decals in the tub or shower. If you need to sit down in the shower, use a plastic, non-slip stool. Keep the floor dry. Clean up any water that spills on the floor as soon as it happens. Remove soap buildup in the tub or shower regularly. Attach bath mats securely with double-sided non-slip rug tape. Do not have throw rugs and other things on the floor that can make you trip. What can I do in the bedroom? Use night lights. Make sure that you have a light by your bed that is easy to reach. Do not use any sheets or  blankets that are too big for your bed. They should not hang down onto the floor. Have a firm chair that has side arms. You can use this for support while you get dressed. Do not have throw rugs and other things on the floor that can make you trip. What can I do in the kitchen? Clean up any spills right away. Avoid walking on wet floors. Keep items that you use a lot in easy-to-reach places. If you need to reach something above you, use a strong step stool that has a grab bar. Keep electrical cords out of the way. Do not use floor polish or wax that makes floors slippery. If you must use wax, use non-skid floor wax. Do not have throw rugs and other things on the floor that can make you trip. What can I do with my stairs? Do not leave any items on the stairs. Make sure that there are handrails on both sides of the stairs and use them. Fix handrails that are broken or loose. Make sure that handrails are as long as the stairways. Check any carpeting to make sure that it is firmly attached to the stairs. Fix any carpet that is loose or worn. Avoid having throw rugs at the top or bottom of the stairs. If you do have throw rugs, attach them to the floor with carpet tape. Make sure that  you have a light switch at the top of the stairs and the bottom of the stairs. If you do not have them, ask someone to add them for you. What else can I do to help prevent falls? Wear shoes that: Do not have high heels. Have rubber bottoms. Are comfortable and fit you well. Are closed at the toe. Do not wear sandals. If you use a stepladder: Make sure that it is fully opened. Do not climb a closed stepladder. Make sure that both sides of the stepladder are locked into place. Ask someone to hold it for you, if possible. Clearly mark and make sure that you can see: Any grab bars or handrails. First and last steps. Where the edge of each step is. Use tools that help you move around (mobility aids) if they are  needed. These include: Canes. Walkers. Scooters. Crutches. Turn on the lights when you go into a dark area. Replace any light bulbs as soon as they burn out. Set up your furniture so you have a clear path. Avoid moving your furniture around. If any of your floors are uneven, fix them. If there are any pets around you, be aware of where they are. Review your medicines with your doctor. Some medicines can make you feel dizzy. This can increase your chance of falling. Ask your doctor what other things that you can do to help prevent falls. This information is not intended to replace advice given to you by your health care provider. Make sure you discuss any questions you have with your health care provider. Document Released: 07/21/2009 Document Revised: 03/01/2016 Document Reviewed: 10/29/2014 Elsevier Interactive Patient Education  2017 [employer].

## 2021-07-10 NOTE — Progress Notes (Signed)
Subjective:   [name] is a 70 y.o. female who presents for an Initial Medicare Annual Wellness Visit.  Review of Systems    N/A       Objective:    There were no vitals filed for this visit. There is no height or weight on file to calculate BMI.  Advanced Directives 04/05/2015  Does Patient Have a Medical Advance Directive? No    Current Medications (verified) Outpatient Encounter Medications as of 07/10/2021  Medication Sig   calcium carbonate (OS-CAL) 600 MG TABS Take 600 mg by mouth 2 (two) times daily with a meal.   denosumab (PROLIA) 60 MG/ML SOLN injection Inject 60 mg into the skin every 6 (six) months. Administer in upper arm, thigh, or abdomen   hydrochlorothiazide (HYDRODIURIL) 25 MG tablet TAKE 1 TABLET DAILY   levothyroxine (SYNTHROID) 137 MCG tablet Take 1 tablet (137 mcg total) by mouth daily before breakfast.   lisinopril (ZESTRIL) 20 MG tablet TAKE 1 TABLET DAILY   Multiple Vitamin (MULTIVITAMIN) tablet Take 1 tablet by mouth daily.   Multiple Vitamins-Minerals (OCUVITE ADULT 50+ PO) Take by mouth.   rosuvastatin (CRESTOR) 20 MG tablet TAKE 1 TABLET DAILY   triamcinolone ointment (KENALOG) 0.5 % APPLY 1 APPLICATION        TOPICALLY TWO TIMES A DAY   TURMERIC PO Take by mouth.   No facility-administered encounter medications on file as of 07/10/2021.    Allergies (verified) Erythromycin, Penicillins, and Thyroid hormones   History: Past Medical History:  Diagnosis Date   Depression    Hyperlipidemia    Hypertension    Thyroid disease    Past Surgical History:  Procedure Laterality Date   APPENDECTOMY     COLONOSCOPY     HERNIA REPAIR     Family History  Problem Relation Age of Onset   Hyperlipidemia Mother    Heart disease Mother    Macular degeneration Mother    Dementia Mother    Colon cancer Father    Colon cancer Maternal Grandfather    Breast cancer Paternal Grandmother    Social History   Socioeconomic History   Marital  status: Married    Spouse name: Not on file   Number of children: Not on file   Years of education: Not on file   Highest education level: Not on file  Occupational History   Not on file  Tobacco Use   Smoking status: Never   Smokeless tobacco: Never  Substance and Sexual Activity   Alcohol use: No    Alcohol/week: 0.0 standard drinks   Drug use: No   Sexual activity: Not on file  Other Topics Concern   Not on file  Social History Narrative   Not on file   Social Determinants of Health   Financial Resource Strain: Not on file  Food Insecurity: Not on file  Transportation Needs: Not on file  Physical Activity: Not on file  Stress: Not on file  Social Connections: Not on file    Tobacco Counseling Counseling given: Not Answered   Clinical Intake:                 Diabetic?no          Activities of Daily Living In your present state of health, do you have any difficulty performing the following activities: 03/14/2021  Hearing? Y  Comment Pt stated since Covid vaccine she has had tintinis  Vision? Y  Comment cataract in both eyes  Difficulty concentrating  or making decisions? N  Walking or climbing stairs? Y  Comment Climbing stairs due to knee issue  Dressing or bathing? N  Doing errands, shopping? N  Some recent data might be hidden    Patient Care Team: Dorothyann Peng, NP as PCP - General (Family Medicine)  Indicate any recent Medical Services you may have received from other than [facility] providers in the past year (date may be approximate).     Assessment:   This is a routine wellness examination for [name].  Hearing/Vision screen No results found.  Dietary issues and exercise activities discussed:     Goals Addressed   None    Depression Screen PHQ 2/9 Scores 07/10/2016  PHQ - 2 Score 0    Fall Risk No flowsheet data found.  FALL RISK PREVENTION PERTAINING TO THE HOME:  Any stairs in or around the home? No  If so, are there any  without handrails? No  Home free of loose throw rugs in walkways, pet beds, electrical cords, etc? Yes  Adequate lighting in your home to reduce risk of falls? Yes   ASSISTIVE DEVICES UTILIZED TO PREVENT FALLS:  Life alert? No  Use of a cane, walker or w/c? No  Grab bars in the bathroom? Yes  Shower chair or bench in shower? No  Elevated toilet seat or a handicapped toilet? Yes   TIMED UP AND GO:  Was the test performed? Yes .  Length of time to ambulate 10 feet: 7 sec.   Gait steady and fast without use of assistive device  Cognitive Function:    Normal cognitive status assessed by direct observation by this Nurse Health Advisor. No abnormalities found.      Immunizations Immunization History  Administered Date(s) Administered   Fluad Quad(high Dose 65+) 10/07/2019, 10/11/2020   Influenza Split 07/08/2012   Influenza Whole 08/18/2007   Influenza, High Dose Seasonal PF 07/12/2017, 07/07/2018   Influenza,inj,Quad PF,6+ Mos 08/12/2013, 07/08/2015   PFIZER(Purple Top)SARS-COV-2 Vaccination 12/16/2019, 01/13/2020   Pneumococcal Conjugate-13 07/12/2017   Pneumococcal Polysaccharide-23 07/18/2018   Td 10/09/1995, 07/03/2010   Tdap 07/25/2012    TDAP status: Up to date  Flu Vaccine status: Up to date  Pneumococcal vaccine status: Up to date  Covid-19 vaccine status: Completed vaccines  Qualifies for Shingles Vaccine? Yes   Zostavax completed No   Shingrix Completed?: No.    Education has been provided regarding the importance of this vaccine. Patient has been advised to call insurance company to determine out of pocket expense if they have not yet received this vaccine. Advised may also receive vaccine at local pharmacy or Health Dept. Verbalized acceptance and understanding.  Screening Tests Health Maintenance  Topic Date Due   Zoster Vaccines- Shingrix (1 of 2) Never done   COVID-19 Vaccine (3 - Booster for Pfizer series) 06/14/2020   COLONOSCOPY (Pts 45-54yrs  Insurance coverage will need to be confirmed)  07/19/2020   INFLUENZA VACCINE  05/08/2021   MAMMOGRAM  05/19/2022   TETANUS/TDAP  07/25/2022   DEXA SCAN  Completed   Hepatitis C Screening  Completed   HPV VACCINES  Aged Out    Health Maintenance  Health Maintenance Due  Topic Date Due   Zoster Vaccines- Shingrix (1 of 2) Never done   COVID-19 Vaccine (3 - Booster for [place] series) 06/14/2020   COLONOSCOPY (Pts 45-21yrs Insurance coverage will need to be confirmed)  07/19/2020   INFLUENZA VACCINE  05/08/2021    Colorectal cancer screening: Referral to GI placed 07/10/2021.  Pt aware the office will call re: appt.  Mammogram status: Completed 05/19/2021. Repeat every year  Bone Density status: Completed 05/05/2021. Results reflect: Bone density results: OSTEOPENIA. Repeat every 5 years.  Lung Cancer Screening: (Low Dose CT Chest recommended if Age 9-80 years, 30 pack-year currently smoking OR have quit w/in 15years.) does not qualify.   Lung Cancer Screening Referral: n/a  Additional Screening:  Hepatitis C Screening: does not qualify; Completed 07/18/2018  Vision Screening: Recommended annual ophthalmology exams for early detection of glaucoma and other disorders of the eye. Is the patient up to date with their annual eye exam?  Yes  Who is the provider or what is the name of the office in which the patient attends annual eye exams? [facility]  If pt is not established with a provider, would they like to be referred to a provider to establish care? No .   Dental Screening: Recommended annual dental exams for proper oral hygiene  Community Resource Referral / Chronic Care Management: CRR required this visit?  No   CCM required this visit?  No      Plan:     I have personally reviewed and noted the following in the patient's chart:   Medical and social history Use of alcohol, tobacco or illicit drugs  Current medications and supplements including opioid  prescriptions. Patient is not currently taking opioid prescriptions. Functional ability and status Nutritional status Physical activity Advanced directives List of other physicians Hospitalizations, surgeries, and ER visits in previous 12 months Vitals Screenings to include cognitive, depression, and falls Referrals and appointments  In addition, I have reviewed and discussed with patient certain preventive protocols, quality metrics, and best practice recommendations. A written personalized care plan for preventive services as well as general preventive health recommendations were provided to patient.     Randel Pigg, LPN   [id number]   Nurse Notes: none

## 2021-07-17 NOTE — Progress Notes (Addendum)
Subjective:   [name] is a 70 y.o. female who presents for an Initial Medicare Annual Wellness Visit.  Review of Systems    N/A Cardiac Risk Factors include: advanced age (>60men, >31 women);dyslipidemia;hypertension     Objective:    Today's Vitals   07/10/21 0859  BP: 122/74  Pulse: 72  Temp: 98.2 F (36.8 C)  SpO2: 98%  Weight: 254 lb (115.2 kg)  Height: 5\' 6"  (1.676 m)   Body mass index is 41 kg/m.  Advanced Directives 07/10/2021 04/05/2015  Does Patient Have a Medical Advance Directive? No No  Would patient like information on creating a medical advance directive? No - Patient declined -    Current Medications (verified) Outpatient Encounter Medications as of 07/10/2021  Medication Sig   calcium carbonate (OS-CAL) 600 MG TABS Take 600 mg by mouth 2 (two) times daily with a meal.   denosumab (PROLIA) 60 MG/ML SOLN injection Inject 60 mg into the skin every 6 (six) months. Administer in upper arm, thigh, or abdomen   hydrochlorothiazide (HYDRODIURIL) 25 MG tablet TAKE 1 TABLET DAILY   levothyroxine (SYNTHROID) 137 MCG tablet Take 1 tablet (137 mcg total) by mouth daily before breakfast.   lisinopril (ZESTRIL) 20 MG tablet TAKE 1 TABLET DAILY   Multiple Vitamin (MULTIVITAMIN) tablet Take 1 tablet by mouth daily.   Multiple Vitamins-Minerals (OCUVITE ADULT 50+ PO) Take by mouth.   rosuvastatin (CRESTOR) 20 MG tablet TAKE 1 TABLET DAILY   triamcinolone ointment (KENALOG) 0.5 % APPLY 1 APPLICATION        TOPICALLY TWO TIMES A DAY   TURMERIC PO Take by mouth.   No facility-administered encounter medications on file as of 07/10/2021.    Allergies (verified) Erythromycin, Penicillins, and Thyroid hormones   History: Past Medical History:  Diagnosis Date   Depression    Hyperlipidemia    Hypertension    Thyroid disease    Past Surgical History:  Procedure Laterality Date   APPENDECTOMY     COLONOSCOPY     HERNIA REPAIR     Family History  Problem  Relation Age of Onset   Hyperlipidemia Mother    Heart disease Mother    Macular degeneration Mother    Dementia Mother    Colon cancer Father    Colon cancer Maternal Grandfather    Breast cancer Paternal Grandmother    Social History   Socioeconomic History   Marital status: Married    Spouse name: Not on file   Number of children: Not on file   Years of education: Not on file   Highest education level: Not on file  Occupational History   Not on file  Tobacco Use   Smoking status: Never   Smokeless tobacco: Never  Substance and Sexual Activity   Alcohol use: No    Alcohol/week: 0.0 standard drinks   Drug use: No   Sexual activity: Not on file  Other Topics Concern   Not on file  Social History Narrative   Not on file   Social Determinants of Health   Financial Resource Strain: Low Risk    Difficulty of Paying Living Expenses: Not hard at all  Food Insecurity: No Food Insecurity   Worried About Charity fundraiser in the Last Year: Never true   [place] in the Last Year: Never true  Transportation Needs: No Transportation Needs   Lack of Transportation (Medical): No   Lack of Transportation (Non-Medical): No  Physical Activity: Inactive  Days of Exercise per Week: 0 days   Minutes of Exercise per Session: 0 min  Stress: No Stress Concern Present   Feeling of Stress : Not at all  Social Connections: Moderately Integrated   Frequency of Communication with Friends and Family: Twice a week   Frequency of Social Gatherings with Friends and Family: Twice a week   Attends Religious Services: More than 4 times per year   Active Member of [employer] or Organizations: No   Attends Music therapist: Never   Marital Status: Married    Tobacco Counseling Counseling given: Not Answered   Clinical Intake:  Pre-visit preparation completed: Yes  Pain : No/denies pain     Nutritional Risks: None Diabetes: No  How often do you need to have someone  help you when you read instructions, pamphlets, or other written materials from your doctor or pharmacy?: 1 - Never What is the last grade level you completed in school?: college  Diabetic?no   Interpreter Needed?: No  Information entered by :: Mickel Baas Thayne Cindric,LPN   Activities of Daily Living In your present state of health, do you have any difficulty performing the following activities: 07/18/2021 07/18/2021  Hearing? N N  Comment - -  Vision? N N  Comment - -  Difficulty concentrating or making decisions? N N  Walking or climbing stairs? N N  Comment - -  Dressing or bathing? N N  Doing errands, shopping? N N  Preparing Food and eating ? N N  Using the Toilet? N N  In the past six months, have you accidently leaked urine? N N  Do you have problems with loss of bowel control? N N  Managing your Medications? N N  Managing your Finances? N -  Housekeeping or managing your Housekeeping? N N  Some recent data might be hidden    Patient Care Team: Dorothyann Peng, NP as PCP - General (Family Medicine)  Indicate any recent Medical Services you may have received from other than [facility] providers in the past year (date may be approximate).     Assessment:   This is a routine wellness examination for [name].  Hearing/Vision screen Vision Screening - Comments:: Annual eye exam wear glasses   Dietary issues and exercise activities discussed: Current Exercise Habits: The patient does not participate in regular exercise at present   Goals Addressed   None   Depression Screen PHQ 2/9 Scores 07/10/2021 07/10/2021 07/10/2016  PHQ - 2 Score 0 0 0    Fall Risk Fall Risk  07/18/2021 07/10/2021  Falls in the past year? 0 0  Number falls in past yr: 0 0  Injury with Fall? 0 0  Follow up Falls evaluation completed Falls evaluation completed    FALL RISK PREVENTION PERTAINING TO THE HOME:  Any stairs in or around the home? No  If so, are there any without handrails? No  Home free of  loose throw rugs in walkways, pet beds, electrical cords, etc? Yes  Adequate lighting in your home to reduce risk of falls? Yes   ASSISTIVE DEVICES UTILIZED TO PREVENT FALLS:  Life alert? No  Use of a cane, walker or w/c? No  Grab bars in the bathroom? Yes  Shower chair or bench in shower? No  Elevated toilet seat or a handicapped toilet? Yes   TIMED UP AND GO:  Was the test performed? Yes .  Length of time to ambulate 10 feet: 7 sec.   Gait steady and fast without use  of assistive device  Cognitive Function:    Normal cognitive status assessed by direct observation by this Nurse Health Advisor. No abnormalities found.      Immunizations Immunization History  Administered Date(s) Administered   Fluad Quad(high Dose 65+) 10/07/2019, 10/11/2020   Influenza Split 07/08/2012   Influenza Whole 08/18/2007   Influenza, High Dose Seasonal PF 07/12/2017, 07/07/2018   Influenza,inj,Quad PF,6+ Mos 08/12/2013, 07/08/2015   PFIZER(Purple Top)SARS-COV-2 Vaccination 12/16/2019, 01/13/2020   Pneumococcal Conjugate-13 07/12/2017   Pneumococcal Polysaccharide-23 07/18/2018   Td 10/09/1995, 07/03/2010   Tdap 07/25/2012    TDAP status: Up to date  Flu Vaccine status: Up to date  Pneumococcal vaccine status: Up to date  Covid-19 vaccine status: Completed vaccines  Qualifies for Shingles Vaccine? Yes   Zostavax completed No   Shingrix Completed?: No.    Education has been provided regarding the importance of this vaccine. Patient has been advised to call insurance company to determine out of pocket expense if they have not yet received this vaccine. Advised may also receive vaccine at local pharmacy or Health Dept. Verbalized acceptance and understanding.  Screening Tests Health Maintenance  Topic Date Due   Zoster Vaccines- Shingrix (1 of 2) Never done   COVID-19 Vaccine (3 - Booster for Pfizer series) 06/14/2020   COLONOSCOPY (Pts 45-40yrs Insurance coverage will need to be  confirmed)  07/19/2020   INFLUENZA VACCINE  05/08/2021   MAMMOGRAM  05/19/2022   TETANUS/TDAP  07/25/2022   DEXA SCAN  Completed   Hepatitis C Screening  Completed   HPV VACCINES  Aged Out    Health Maintenance  Health Maintenance Due  Topic Date Due   Zoster Vaccines- Shingrix (1 of 2) Never done   COVID-19 Vaccine (3 - Booster for [place] series) 06/14/2020   COLONOSCOPY (Pts 45-23yrs Insurance coverage will need to be confirmed)  07/19/2020   INFLUENZA VACCINE  05/08/2021    Colorectal cancer screening: Referral to GI placed 07/10/2021. Pt aware the office will call re: appt.  Mammogram status: Completed 05/19/2021. Repeat every year  Bone Density status: Completed 05/05/2021. Results reflect: Bone density results: OSTEOPENIA. Repeat every 5 years.  Lung Cancer Screening: (Low Dose CT Chest recommended if Age 25-80 years, 30 pack-year currently smoking OR have quit w/in 15years.) does not qualify.   Lung Cancer Screening Referral: n/a  Additional Screening:  Hepatitis C Screening: does not qualify; Completed 07/18/2018  Vision Screening: Recommended annual ophthalmology exams for early detection of glaucoma and other disorders of the eye. Is the patient up to date with their annual eye exam?  Yes  Who is the provider or what is the name of the office in which the patient attends annual eye exams? [facility]  If pt is not established with a provider, would they like to be referred to a provider to establish care? No .   Dental Screening: Recommended annual dental exams for proper oral hygiene  Community Resource Referral / Chronic Care Management: CRR required this visit?  No   CCM required this visit?  No      Plan:     I have personally reviewed and noted the following in the patient's chart:   Medical and social history Use of alcohol, tobacco or illicit drugs  Current medications and supplements including opioid prescriptions. Patient is not currently  taking opioid prescriptions. Functional ability and status Nutritional status Physical activity Advanced directives List of other physicians Hospitalizations, surgeries, and ER visits in previous 12 months Vitals Screenings  to include cognitive, depression, and falls Referrals and appointments  In addition, I have reviewed and discussed with patient certain preventive protocols, quality metrics, and best practice recommendations. A written personalized care plan for preventive services as well as general preventive health recommendations were provided to patient.     Randel Pigg, LPN   28/83/3744   Nurse Notes: none

## 2021-10-02 ENCOUNTER — Other Ambulatory Visit: Payer: Self-pay | Admitting: Adult Health

## 2021-10-02 DIAGNOSIS — I10 Essential (primary) hypertension: Secondary | ICD-10-CM

## 2021-10-18 ENCOUNTER — Telehealth: Payer: Self-pay

## 2021-10-18 NOTE — Telephone Encounter (Signed)
I called and spoke with pt. It is time to schedule her Prolia injection. Appt made for 1/13 at 10am.

## 2021-10-20 ENCOUNTER — Ambulatory Visit (INDEPENDENT_AMBULATORY_CARE_PROVIDER_SITE_OTHER): Payer: Medicare Other

## 2021-10-20 DIAGNOSIS — M810 Age-related osteoporosis without current pathological fracture: Secondary | ICD-10-CM | POA: Diagnosis not present

## 2021-10-20 MED ORDER — DENOSUMAB 60 MG/ML ~~LOC~~ SOSY
60.0000 mg | PREFILLED_SYRINGE | Freq: Once | SUBCUTANEOUS | Status: AC
  Administered 2021-10-20: 60 mg via SUBCUTANEOUS

## 2021-10-20 NOTE — Progress Notes (Signed)
°  Patient tolerated Prolia well give in left arm per C. Nafziger order.

## 2022-03-24 ENCOUNTER — Other Ambulatory Visit: Payer: Self-pay | Admitting: Adult Health

## 2022-03-24 DIAGNOSIS — I10 Essential (primary) hypertension: Secondary | ICD-10-CM

## 2022-04-09 ENCOUNTER — Encounter: Payer: Self-pay | Admitting: Adult Health

## 2022-04-09 NOTE — Telephone Encounter (Signed)
Patient is due for next Prolia injection on or after 04/19/22. Estimated cost is $0. Mychart message sent to patient to schedule appointment.

## 2022-04-20 ENCOUNTER — Other Ambulatory Visit: Payer: Self-pay | Admitting: Adult Health

## 2022-04-20 DIAGNOSIS — Z1231 Encounter for screening mammogram for malignant neoplasm of breast: Secondary | ICD-10-CM

## 2022-04-23 ENCOUNTER — Encounter: Payer: Self-pay | Admitting: Gastroenterology

## 2022-04-23 ENCOUNTER — Ambulatory Visit (INDEPENDENT_AMBULATORY_CARE_PROVIDER_SITE_OTHER): Payer: Medicare Other | Admitting: *Deleted

## 2022-04-23 DIAGNOSIS — M810 Age-related osteoporosis without current pathological fracture: Secondary | ICD-10-CM

## 2022-04-23 MED ORDER — DENOSUMAB 60 MG/ML ~~LOC~~ SOSY
60.0000 mg | PREFILLED_SYRINGE | Freq: Once | SUBCUTANEOUS | Status: AC
  Administered 2022-04-23: 60 mg via SUBCUTANEOUS

## 2022-04-23 NOTE — Progress Notes (Signed)
Per orders of Dr. Fry, injection of Prolia 60mg given by Zhyon Antenucci A. Patient tolerated injection well.  

## 2022-05-04 ENCOUNTER — Other Ambulatory Visit: Payer: Self-pay | Admitting: Adult Health

## 2022-05-04 DIAGNOSIS — E039 Hypothyroidism, unspecified: Secondary | ICD-10-CM

## 2022-05-04 NOTE — Telephone Encounter (Signed)
Patient need to schedule an ov for more refills. 

## 2022-05-22 ENCOUNTER — Ambulatory Visit (AMBULATORY_SURGERY_CENTER): Payer: Self-pay

## 2022-05-22 VITALS — Ht 65.0 in | Wt 251.0 lb

## 2022-05-22 DIAGNOSIS — Z800 Family history of malignant neoplasm of digestive organs: Secondary | ICD-10-CM

## 2022-05-22 DIAGNOSIS — Z86010 Personal history of colonic polyps: Secondary | ICD-10-CM

## 2022-05-22 MED ORDER — NA SULFATE-K SULFATE-MG SULF 17.5-3.13-1.6 GM/177ML PO SOLN: 1.0000 | Freq: Once | ORAL | 0 refills | Status: AC

## 2022-05-22 NOTE — Progress Notes (Signed)
No egg or soy allergy known to patient  No issues known to pt with past sedation with any surgeries or procedures Patient denies ever being told they had issues or difficulty with intubation  No FH of Malignant Hyperthermia Pt is not on diet pills Pt is not on home 02  Pt is not on blood thinners  Pt denies issues with constipation - patient reports she has intermittent constipation but has not had any recent episodes- reports she will increase po fluids and take OTC meds to relieve constipation if needed No A fib or A flutter Have any cardiac testing pending--NO Pt instructed to use [email or web address] or GoodRx for a price reduction on prep

## 2022-05-23 DIAGNOSIS — Z1231 Encounter for screening mammogram for malignant neoplasm of breast: Secondary | ICD-10-CM

## 2022-05-27 ENCOUNTER — Other Ambulatory Visit: Payer: Self-pay | Admitting: Adult Health

## 2022-05-27 DIAGNOSIS — I10 Essential (primary) hypertension: Secondary | ICD-10-CM

## 2022-06-01 ENCOUNTER — Other Ambulatory Visit: Payer: Self-pay | Admitting: Adult Health

## 2022-06-01 DIAGNOSIS — E039 Hypothyroidism, unspecified: Secondary | ICD-10-CM

## 2022-06-05 ENCOUNTER — Ambulatory Visit
Admission: RE | Admit: 2022-06-05 | Discharge: 2022-06-05 | Disposition: A | Discharge status: Home / Self Care | Payer: Medicare Other | Source: Ambulatory Visit | Attending: Adult Health | Admitting: Adult Health

## 2022-06-05 DIAGNOSIS — Z1231 Encounter for screening mammogram for malignant neoplasm of breast: Secondary | ICD-10-CM

## 2022-06-12 ENCOUNTER — Encounter: Payer: Self-pay | Admitting: Gastroenterology

## 2022-06-12 ENCOUNTER — Ambulatory Visit (AMBULATORY_SURGERY_CENTER): Payer: Medicare Other | Admitting: Gastroenterology

## 2022-06-12 VITALS — BP 100/67 | HR 66 | Temp 98.1°F | Resp 16 | Ht 65.0 in | Wt 251.0 lb

## 2022-06-12 DIAGNOSIS — Z09 Encounter for follow-up examination after completed treatment for conditions other than malignant neoplasm: Secondary | ICD-10-CM | POA: Diagnosis not present

## 2022-06-12 DIAGNOSIS — D128 Benign neoplasm of rectum: Secondary | ICD-10-CM | POA: Diagnosis not present

## 2022-06-12 DIAGNOSIS — Z800 Family history of malignant neoplasm of digestive organs: Secondary | ICD-10-CM | POA: Diagnosis not present

## 2022-06-12 DIAGNOSIS — Z86010 Personal history of colonic polyps: Secondary | ICD-10-CM | POA: Diagnosis not present

## 2022-06-12 DIAGNOSIS — D123 Benign neoplasm of transverse colon: Secondary | ICD-10-CM | POA: Diagnosis not present

## 2022-06-12 MED ORDER — SODIUM CHLORIDE 0.9 % IV SOLN: 500.0000 mL | Freq: Once | INTRAVENOUS | Status: DC

## 2022-06-12 NOTE — Patient Instructions (Signed)
YOU HAD AN ENDOSCOPIC PROCEDURE TODAY: Refer to the procedure report and other information in the discharge instructions given to you for any specific questions about what was found during the examination. If this information does not answer your questions, please call [place] office at [phone] to clarify.   YOU SHOULD EXPECT: Some feelings of bloating in the abdomen. Passage of more gas than usual. Walking can help get rid of the air that was put into your GI tract during the procedure and reduce the bloating. If you had a lower endoscopy (such as a colonoscopy or flexible sigmoidoscopy) you may notice spotting of blood in your stool or on the toilet paper. Some abdominal soreness may be present for a day or two, also.  DIET: Your first meal following the procedure should be a light meal and then it is ok to progress to your normal diet. A half-sandwich or bowl of soup is an example of a good first meal. Heavy or fried foods are harder to digest and may make you feel nauseous or bloated. Drink plenty of fluids but you should avoid alcoholic beverages for 24 hours. If you had a esophageal dilation, please see attached instructions for diet.    ACTIVITY: Your care partner should take you home directly after the procedure. You should plan to take it easy, moving slowly for the rest of the day. You can resume normal activity the day after the procedure however YOU SHOULD NOT DRIVE, use power tools, machinery or perform tasks that involve climbing or major physical exertion for 24 hours (because of the sedation medicines used during the test).   SYMPTOMS TO REPORT IMMEDIATELY: A gastroenterologist can be reached at any hour. Please call [phone]  for any of the following symptoms:  Following lower endoscopy (colonoscopy, flexible sigmoidoscopy) Excessive amounts of blood in the stool  Significant tenderness, worsening of abdominal pains  Swelling of the abdomen that is new, acute  Fever of 100 or  higher  Following upper endoscopy (EGD, EUS, ERCP, esophageal dilation) Vomiting of blood or coffee ground material  New, significant abdominal pain  New, significant chest pain or pain under the shoulder blades  Painful or persistently difficult swallowing  New shortness of breath  Black, tarry-looking or red, bloody stools  FOLLOW UP:  If any biopsies were taken you will be contacted by phone or by letter within the next 1-3 weeks. Call [phone]  if you have not heard about the biopsies in 3 weeks.  Please also call with any specific questions about appointments or follow up tests.YOU HAD AN ENDOSCOPIC PROCEDURE TODAY: Refer to the procedure report and other information in the discharge instructions given to you for any specific questions about what was found during the examination. If this information does not answer your questions, please call [place] office at [phone] to clarify.   YOU SHOULD EXPECT: Some feelings of bloating in the abdomen. Passage of more gas than usual. Walking can help get rid of the air that was put into your GI tract during the procedure and reduce the bloating. If you had a lower endoscopy (such as a colonoscopy or flexible sigmoidoscopy) you may notice spotting of blood in your stool or on the toilet paper. Some abdominal soreness may be present for a day or two, also.  DIET: Your first meal following the procedure should be a light meal and then it is ok to progress to your normal diet. A half-sandwich or bowl of soup is an example of a   good first meal. Heavy or fried foods are harder to digest and may make you feel nauseous or bloated. Drink plenty of fluids but you should avoid alcoholic beverages for 24 hours. If you had a esophageal dilation, please see attached instructions for diet.    ACTIVITY: Your care partner should take you home directly after the procedure. You should plan to take it easy, moving slowly for the rest of the day. You can resume  normal activity the day after the procedure however YOU SHOULD NOT DRIVE, use power tools, machinery or perform tasks that involve climbing or major physical exertion for 24 hours (because of the sedation medicines used during the test).   SYMPTOMS TO REPORT IMMEDIATELY: A gastroenterologist can be reached at any hour. Please call [phone]  for any of the following symptoms:  Following lower endoscopy (colonoscopy, flexible sigmoidoscopy) Excessive amounts of blood in the stool  Significant tenderness, worsening of abdominal pains  Swelling of the abdomen that is new, acute  Fever of 100 or higher  FOLLOW UP:  If any biopsies were taken you will be contacted by phone or by letter within the next 1-3 weeks. Call [phone]  if you have not heard about the biopsies in 3 weeks.  Please also call with any specific questions about appointments or follow up tests. 

## 2022-06-12 NOTE — Progress Notes (Signed)
VS completed by CW.   Pt's states no medical or surgical changes since previsit or office visit.  

## 2022-06-12 NOTE — Op Note (Addendum)
[place] Patient Name: [name] Procedure Date: 06/12/2022 10:25 AM MRN: [id number] Endoscopist: Ladene Artist , MD Age: 71 Referring MD:  Date of Birth: Nov 02, 1950 Gender: Female Account #: [id number] Procedure:                Colonoscopy Indications:              Surveillance: Personal history of adenomatous                            polyps on last colonoscopy > 5 years ago, Family                            history of colon cancer, 1st-degree relative Medicines:                Monitored Anesthesia Care Procedure:                Pre-Anesthesia Assessment:                           - Prior to the procedure, a History and Physical                            was performed, and patient medications and                            allergies were reviewed. The patient's tolerance of                            previous anesthesia was also reviewed. The risks                            and benefits of the procedure and the sedation                            options and risks were discussed with the patient.                            All questions were answered, and informed consent                            was obtained. Prior Anticoagulants: The patient has                            taken no previous anticoagulant or antiplatelet                            agents. ASA Grade Assessment: III - A patient with                            severe systemic disease. After reviewing the risks                            and benefits, the patient was deemed in  satisfactory condition to undergo the procedure.                           After obtaining informed consent, the colonoscope                            was passed under direct vision. Throughout the                            procedure, the patient's blood pressure, pulse, and                            oxygen saturations were monitored continuously. The                            CF [id number]  #[id number] was introduced through the anus                            and advanced to the the cecum, identified by                            appendiceal orifice and ileocecal valve. The                            ileocecal valve, appendiceal orifice, and rectum                            were photographed. The quality of the bowel                            preparation was good. The colonoscopy was performed                            without difficulty. The patient tolerated the                            procedure well. Scope In: 10:33:45 AM Scope Out: 10:48:41 AM Scope Withdrawal Time: 0 hours 12 minutes 22 seconds  Total Procedure Duration: 0 hours 14 minutes 56 seconds  Findings:                 The perianal and digital rectal examinations were                            normal.                           Two sessile polyps were found in the distal rectum                            and transverse colon. The polyps were 7 to 8 mm in                            size. These polyps were removed with a cold snare.  Resection and retrieval were complete.                           Multiple small-mouthed diverticula were found in                            the left colon. There was no evidence of                            diverticular bleeding.                           Internal hemorrhoids were found during                            retroflexion. The hemorrhoids were moderate and                            Grade I (internal hemorrhoids that do not prolapse).                           The exam was otherwise without abnormality on                            direct and retroflexion views. Complications:            No immediate complications. Estimated blood loss:                            None. Estimated Blood Loss:     Estimated blood loss: none. Impression:               - Two 7 to 8 mm polyps in the distal rectum and in                            the transverse  colon, removed with a cold snare.                            Resected and retrieved.                           - Moderate diverticulosis in the left colon.                           - Internal hemorrhoids.                           - The examination was otherwise normal on direct                            and retroflexion views. Recommendation:           - Repeat colonoscopy after studies are complete for                            surveillance based on pathology results.                           -  Patient has a contact number available for                            emergencies. The signs and symptoms of potential                            delayed complications were discussed with the                            patient. Return to normal activities tomorrow.                            Written discharge instructions were provided to the                            patient.                           - High fiber diet.                           - Continue present medications.                           - Await pathology results. Ladene Artist, MD 06/12/2022 10:55:57 AM This report has been signed electronically.

## 2022-06-12 NOTE — Progress Notes (Signed)
A and O x3. Report to RN. Tolerated MAC anesthesia well. 

## 2022-06-12 NOTE — Progress Notes (Signed)
Called to room to assist during endoscopic procedure.  Patient ID and intended procedure confirmed with present staff. Received instructions for my participation in the procedure from the performing physician.  

## 2022-06-12 NOTE — Progress Notes (Signed)
History & Physical  Primary Care Physician:  Dorothyann Peng, NP Primary Gastroenterologist: Lucio Edward, MD  CHIEF COMPLAINT:  [facility], Personal history of colon polyps   HPI: NERI VIEYRA is a 71 y.o. female with a personal history of adenomatous colon polyps in 2016 and family history of colon cancer, father, for colonoscopy.   Past Medical History:  Diagnosis Date   Arthritis    bilateral knees   Cataract    not a surgical candidate at this time (05/22/2022)   Depression    GERD (gastroesophageal reflux disease)    Hyperlipidemia    on meds   Hypertension    on meds   Osteoporosis    on meds   Thyroid disease    on meds    Past Surgical History:  Procedure Laterality Date   APPENDECTOMY  1971   COLONOSCOPY  2016   MS-suprep(exc)-tics/TA x2   POLYPECTOMY  4098   TA x 2   UMBILICAL HERNIA REPAIR  2011    Prior to Admission medications   Medication Sig Start Date End Date Taking? Authorizing Provider  calcium carbonate (OS-CAL) 600 MG TABS Take 600 mg by mouth 2 (two) times daily with a meal.   Yes [provider]  Cholecalciferol (VITAMIN D3 PO) Take 1 tablet by mouth daily at 6 (six) AM.   Yes [provider]  hydrochlorothiazide (HYDRODIURIL) 25 MG tablet Take 1 tablet (25 mg total) by mouth daily. SCHEDULE APPT FOR FUTURE REFILLS 03/26/22  Yes Nafziger, Tommi Rumps, NP  lisinopril (ZESTRIL) 20 MG tablet TAKE 1 TABLET DAILY. PLEASEMAKE AN APPOINTMENT FOR    FURTHER REFILLS (1 MONTH   GIVEN UNTIL APPOINTMENT) 05/28/22  Yes Nafziger, Tommi Rumps, NP  Multiple Vitamin (MULTIVITAMIN) tablet Take 1 tablet by mouth daily.   Yes [provider]  Multiple Vitamins-Minerals (OCUVITE ADULT 50+ PO) Take by mouth.   Yes [provider]  rosuvastatin (CRESTOR) 20 MG tablet TAKE 1 TABLET DAILY 10/03/21  Yes Nafziger, Tommi Rumps, NP  SYNTHROID 137 MCG tablet TAKE 1 TABLET (137 MCG TOTAL) BY MOUTH DAILY BEFORE BREAKFAST. 06/04/22  Yes Nafziger, Tommi Rumps, NP   triamcinolone ointment (KENALOG) 0.5 % APPLY 1 APPLICATION        TOPICALLY TWO TIMES A DAY 05/01/19  Yes Nafziger, Tommi Rumps, NP  TURMERIC PO Take 800 mg by mouth daily at 6 (six) AM.   Yes [provider]  denosumab (PROLIA) 60 MG/ML SOLN injection Inject 60 mg into the skin every 6 (six) months. Administer in upper arm, thigh, or abdomen 10/24/17   Dorothyann Peng, NP    Current Outpatient Medications  Medication Sig Dispense Refill   calcium carbonate (OS-CAL) 600 MG TABS Take 600 mg by mouth 2 (two) times daily with a meal.     Cholecalciferol (VITAMIN D3 PO) Take 1 tablet by mouth daily at 6 (six) AM.     hydrochlorothiazide (HYDRODIURIL) 25 MG tablet Take 1 tablet (25 mg total) by mouth daily. SCHEDULE APPT FOR FUTURE REFILLS 30 tablet 0   lisinopril (ZESTRIL) 20 MG tablet TAKE 1 TABLET DAILY. PLEASEMAKE AN APPOINTMENT FOR    FURTHER REFILLS (1 MONTH   GIVEN UNTIL APPOINTMENT) 30 tablet 0   Multiple Vitamin (MULTIVITAMIN) tablet Take 1 tablet by mouth daily.     Multiple Vitamins-Minerals (OCUVITE ADULT 50+ PO) Take by mouth.     rosuvastatin (CRESTOR) 20 MG tablet TAKE 1 TABLET DAILY 90 tablet 1   SYNTHROID 137 MCG tablet TAKE 1 TABLET (137 MCG TOTAL)  BY MOUTH DAILY BEFORE BREAKFAST. 30 tablet 0   triamcinolone ointment (KENALOG) 0.5 % APPLY 1 APPLICATION        TOPICALLY TWO TIMES A DAY 30 g 3   TURMERIC PO Take 800 mg by mouth daily at 6 (six) AM.     denosumab (PROLIA) 60 MG/ML SOLN injection Inject 60 mg into the skin every 6 (six) months. Administer in upper arm, thigh, or abdomen 1 mL 0   Current Facility-Administered Medications  Medication Dose Route Frequency Provider Last Rate Last Admin   0.9 %  sodium chloride infusion  500 mL Intravenous Once Ladene Artist, MD        Allergies as of 06/12/2022 - Review Complete 06/12/2022  Allergen Reaction Noted   Erythromycin     Penicillins     Thyroid hormones Hives 05/23/2015    Family History  Problem Relation Age of  Onset   Hyperlipidemia Mother    Heart disease Mother    Macular degeneration Mother    Dementia Mother    Colon polyps Father 61   Colon cancer Father 2   Colon polyps Maternal Grandfather 67   Colon cancer Maternal Grandfather 14   Breast cancer Paternal Grandmother    Esophageal cancer Neg Hx    Stomach cancer Neg Hx    Rectal cancer Neg Hx     Social History   Socioeconomic History   Marital status: Married    Spouse name: Not on file   Number of children: Not on file   Years of education: Not on file   Highest education level: Not on file  Occupational History   Not on file  Tobacco Use   Smoking status: Never   Smokeless tobacco: Never  Vaping Use   Vaping Use: Never used  Substance and Sexual Activity   Alcohol use: No    Alcohol/week: 0.0 standard drinks of alcohol   Drug use: No   Sexual activity: Not on file  Other Topics Concern   Not on file  Social History Narrative   Not on file   Social Determinants of Health   Financial Resource Strain: Low Risk  (07/10/2021)   Overall Financial Resource Strain (CARDIA)    Difficulty of Paying Living Expenses: Not hard at all  Food Insecurity: No Food Insecurity (07/10/2021)   Hunger Vital Sign    Worried About Running Out of Food in the Last Year: Never true    [place] in the Last Year: Never true  Transportation Needs: No Transportation Needs (07/10/2021)   PRAPARE - Hydrologist (Medical): No    Lack of Transportation (Non-Medical): No  Physical Activity: Inactive (07/10/2021)   Exercise Vital Sign    Days of Exercise per Week: 0 days    Minutes of Exercise per Session: 0 min  Stress: No Stress Concern Present (07/10/2021)   [place]    Feeling of Stress : Not at all  Social Connections: Moderately Integrated (07/10/2021)   Social Connection and Isolation Panel [NHANES]    Frequency of Communication with  Friends and Family: Twice a week    Frequency of Social Gatherings with Friends and Family: Twice a week    Attends Religious Services: More than 4 times per year    Active Member of [employer] or Organizations: No    Attends Archivist Meetings: Never    Marital Status: Married  Human resources officer Violence:  Not At Risk (07/10/2021)   Humiliation, Afraid, Rape, and Kick questionnaire    Fear of Current or Ex-Partner: No    Emotionally Abused: No    Physically Abused: No    Sexually Abused: No    Review of Systems:  All systems reviewed were negative except where noted in HPI.   Physical Exam: Colon cancer screening.  General:  Alert, well-developed, in NAD Head:  Normocephalic and atraumatic. Eyes:  Sclera clear, no icterus.   Conjunctiva pink. Ears:  Normal auditory acuity. Mouth:  No deformity or lesions.  Neck:  Supple; no masses . Lungs:  Clear throughout to auscultation.   No wheezes, crackles, or rhonchi. No acute distress. Heart:  Regular rate and rhythm; no murmurs. Abdomen:  Soft, nondistended, nontender. No masses, hepatomegaly. No obvious masses.  Normal bowel .    Rectal:  Deferred   Msk:  Symmetrical without gross deformities.. Pulses:  Normal pulses noted. Extremities:  Without edema. Neurologic:  Alert and  oriented x4;  grossly normal neurologically. Skin:  Intact without significant lesions or rashes. Cervical Nodes:  No significant cervical adenopathy. Psych:  Alert and cooperative. Normal mood and affect.   Impression / Plan:   Personal history of adenomatous colon polyps in 2016 and family history of colon cancer, father, for colonoscopy.  Pricilla Riffle. Fuller Plan  06/12/2022, 10:22 AM See Shea Evans, [facility], to contact our on call provider

## 2022-06-13 ENCOUNTER — Telehealth: Payer: Self-pay

## 2022-06-13 NOTE — Telephone Encounter (Signed)
  Follow up Call-     06/12/2022    9:39 AM  Call back number  Post procedure Call Back phone  # [phone]  Permission to leave phone message Yes     Patient questions:  Do you have a fever, pain , or abdominal swelling? No. Pain Score  0 *  Have you tolerated food without any problems? Yes.    Have you been able to return to your normal activities? Yes.    Do you have any questions about your discharge instructions: Diet   No. Medications  No. Follow up visit  No.  Do you have questions or concerns about your Care? No.  Actions: * If pain score is 4 or above: No action needed, pain <4.

## 2022-06-27 ENCOUNTER — Encounter: Payer: Self-pay | Admitting: Gastroenterology

## 2022-07-06 ENCOUNTER — Other Ambulatory Visit: Payer: Self-pay | Admitting: Adult Health

## 2022-07-06 DIAGNOSIS — E039 Hypothyroidism, unspecified: Secondary | ICD-10-CM

## 2022-08-13 ENCOUNTER — Telehealth: Payer: Self-pay | Admitting: *Deleted

## 2022-08-13 ENCOUNTER — Ambulatory Visit (INDEPENDENT_AMBULATORY_CARE_PROVIDER_SITE_OTHER): Payer: Medicare Other

## 2022-08-13 ENCOUNTER — Encounter: Payer: Self-pay | Admitting: *Deleted

## 2022-08-13 VITALS — Ht 66.0 in | Wt 255.0 lb

## 2022-08-13 DIAGNOSIS — Z0000 Encounter for general adult medical examination without abnormal findings: Secondary | ICD-10-CM

## 2022-08-13 NOTE — Patient Instructions (Addendum)
[name] , Thank you for taking time to come for your Medicare Wellness Visit. I appreciate your ongoing commitment to your health goals. Please review the following plan we discussed and let me know if I can assist you in the future.   These are the goals we discussed:  Goals       Stay healthy (pt-stated)      Finish my projects        This is a list of the screening recommended for you and due dates:  Health Maintenance  Topic Date Due   COVID-19 Vaccine (3 - Pfizer series) 08/29/2022*   Zoster (Shingles) Vaccine (1 of 2) 11/13/2022*   Flu Shot  01/06/2023*   Tetanus Vaccine  08/14/2023*   Mammogram  06/06/2023   Medicare Annual Wellness Visit  08/14/2023   Colon Cancer Screening  06/13/2027   Pneumonia Vaccine  Completed   DEXA scan (bone density measurement)  Completed   Hepatitis C Screening: USPSTF Recommendation to screen - Ages 41-79 yo.  Completed   HPV Vaccine  Aged Out  *Topic was postponed. The date shown is not the original due date.    Advanced directives: Advance directive discussed with you today. Even though you declined this today, please call our office should you change your mind, and we can give you the proper paperwork for you to fill out.   Conditions/risks identified: None  Next appointment: Follow up in one year for your annual wellness visit     Preventive Care 65 Years and Older, Female Preventive care refers to lifestyle choices and visits with your health care provider that can promote health and wellness. What does preventive care include? A yearly physical exam. This is also called an annual well check. Dental exams once or twice a year. Routine eye exams. Ask your health care provider how often you should have your eyes checked. Personal lifestyle choices, including: Daily care of your teeth and gums. Regular physical activity. Eating a healthy diet. Avoiding tobacco and drug use. Limiting alcohol use. Practicing safe sex. Taking  low-dose aspirin every day. Taking vitamin and mineral supplements as recommended by your health care provider. What happens during an annual well check? The services and screenings done by your health care provider during your annual well check will depend on your age, overall health, lifestyle risk factors, and family history of disease. Counseling  Your health care provider may ask you questions about your: Alcohol use. Tobacco use. Drug use. Emotional well-being. Home and relationship well-being. Sexual activity. Eating habits. History of falls. Memory and ability to understand (cognition). Work and work Statistician. Reproductive health. Screening  You may have the following tests or measurements: Height, weight, and BMI. Blood pressure. Lipid and cholesterol levels. These may be checked every 5 years, or more frequently if you are over 21 years old. Skin check. Lung cancer screening. You may have this screening every year starting at age 18 if you have a 30-pack-year history of smoking and currently smoke or have quit within the past 15 years. Fecal occult blood test (FOBT) of the stool. You may have this test every year starting at age 32. Flexible sigmoidoscopy or colonoscopy. You may have a sigmoidoscopy every 5 years or a colonoscopy every 10 years starting at age 16. Hepatitis C blood test. Hepatitis B blood test. Sexually transmitted disease (STD) testing. Diabetes screening. This is done by checking your blood sugar (glucose) after you have not eaten for a while (fasting). You may have  this done every 1-3 years. Bone density scan. This is done to screen for osteoporosis. You may have this done starting at age 21. Mammogram. This may be done every 1-2 years. Talk to your health care provider about how often you should have regular mammograms. Talk with your health care provider about your test results, treatment options, and if necessary, the need for more tests. Vaccines   Your health care provider may recommend certain vaccines, such as: Influenza vaccine. This is recommended every year. Tetanus, diphtheria, and acellular pertussis (Tdap, Td) vaccine. You may need a Td booster every 10 years. Zoster vaccine. You may need this after age 73. Pneumococcal 13-valent conjugate (PCV13) vaccine. One dose is recommended after age 17. Pneumococcal polysaccharide (PPSV23) vaccine. One dose is recommended after age 76. Talk to your health care provider about which screenings and vaccines you need and how often you need them. This information is not intended to replace advice given to you by your health care provider. Make sure you discuss any questions you have with your health care provider. Document Released: 10/21/2015 Document Revised: 06/13/2016 Document Reviewed: 07/26/2015 Elsevier Interactive Patient Education  2017 [place] Prevention in the Home Falls can cause injuries. They can happen to people of all ages. There are many things you can do to make your home safe and to help prevent falls. What can I do on the outside of my home? Regularly fix the edges of walkways and driveways and fix any cracks. Remove anything that might make you trip as you walk through a door, such as a raised step or threshold. Trim any bushes or trees on the path to your home. Use bright outdoor lighting. Clear any walking paths of anything that might make someone trip, such as rocks or tools. Regularly check to see if handrails are loose or broken. Make sure that both sides of any steps have handrails. Any raised decks and porches should have guardrails on the edges. Have any leaves, snow, or ice cleared regularly. Use sand or salt on walking paths during winter. Clean up any spills in your garage right away. This includes oil or grease spills. What can I do in the bathroom? Use night lights. Install grab bars by the toilet and in the tub and shower. Do not use towel  bars as grab bars. Use non-skid mats or decals in the tub or shower. If you need to sit down in the shower, use a plastic, non-slip stool. Keep the floor dry. Clean up any water that spills on the floor as soon as it happens. Remove soap buildup in the tub or shower regularly. Attach bath mats securely with double-sided non-slip rug tape. Do not have throw rugs and other things on the floor that can make you trip. What can I do in the bedroom? Use night lights. Make sure that you have a light by your bed that is easy to reach. Do not use any sheets or blankets that are too big for your bed. They should not hang down onto the floor. Have a firm chair that has side arms. You can use this for support while you get dressed. Do not have throw rugs and other things on the floor that can make you trip. What can I do in the kitchen? Clean up any spills right away. Avoid walking on wet floors. Keep items that you use a lot in easy-to-reach places. If you need to reach something above you, use a strong step stool  that has a grab bar. Keep electrical cords out of the way. Do not use floor polish or wax that makes floors slippery. If you must use wax, use non-skid floor wax. Do not have throw rugs and other things on the floor that can make you trip. What can I do with my stairs? Do not leave any items on the stairs. Make sure that there are handrails on both sides of the stairs and use them. Fix handrails that are broken or loose. Make sure that handrails are as long as the stairways. Check any carpeting to make sure that it is firmly attached to the stairs. Fix any carpet that is loose or worn. Avoid having throw rugs at the top or bottom of the stairs. If you do have throw rugs, attach them to the floor with carpet tape. Make sure that you have a light switch at the top of the stairs and the bottom of the stairs. If you do not have them, ask someone to add them for you. What else can I do to help  prevent falls? Wear shoes that: Do not have high heels. Have rubber bottoms. Are comfortable and fit you well. Are closed at the toe. Do not wear sandals. If you use a stepladder: Make sure that it is fully opened. Do not climb a closed stepladder. Make sure that both sides of the stepladder are locked into place. Ask someone to hold it for you, if possible. Clearly mark and make sure that you can see: Any grab bars or handrails. First and last steps. Where the edge of each step is. Use tools that help you move around (mobility aids) if they are needed. These include: Canes. Walkers. Scooters. Crutches. Turn on the lights when you go into a dark area. Replace any light bulbs as soon as they burn out. Set up your furniture so you have a clear path. Avoid moving your furniture around. If any of your floors are uneven, fix them. If there are any pets around you, be aware of where they are. Review your medicines with your doctor. Some medicines can make you feel dizzy. This can increase your chance of falling. Ask your doctor what other things that you can do to help prevent falls. This information is not intended to replace advice given to you by your health care provider. Make sure you discuss any questions you have with your health care provider. Document Released: 07/21/2009 Document Revised: 03/01/2016 Document Reviewed: 10/29/2014 Elsevier Interactive Patient Education  2017 [employer].

## 2022-08-13 NOTE — Progress Notes (Signed)
Subjective:   [name] is a 71 y.o. female who presents for Medicare Annual (Subsequent) preventive examination.  Review of Systems    Virtual Visit via Telephone Note  I connected with  [name] on 08/13/22 at  1:30 PM EST by telephone and verified that I am speaking with the correct person using two identifiers.  Location: Patient: Home Provider: Office Persons participating in the virtual visit: patient/Nurse Health Advisor   I discussed the limitations, risks, security and privacy concerns of performing an evaluation and management service by telephone and the availability of in person appointments. The patient expressed understanding and agreed to proceed.  Interactive audio and video telecommunications were attempted between this nurse and patient, however failed, due to patient having technical difficulties OR patient did not have access to video capability.  We continued and completed visit with audio only.  Some vital signs may be absent or patient reported.   [name], LPN  Cardiac Risk Factors include: advanced age (>50mn, >>2women);hypertension;obesity (BMI >30kg/m2)     Objective:    Today's Vitals   08/13/22 1338  Weight: 255 lb (115.7 kg)  Height: '5\' 6"'$  (1.676 m)   Body mass index is 41.16 kg/m.     08/13/2022    1:45 PM 07/10/2021    9:03 AM 04/05/2015   12:08 AM  Advanced Directives  Does Patient Have a Medical Advance Directive? No No No  Would patient like information on creating a medical advance directive? No - Patient declined No - Patient declined     Current Medications (verified) Outpatient Encounter Medications as of 08/13/2022  Medication Sig   calcium carbonate (OS-CAL) 600 MG TABS Take 600 mg by mouth 2 (two) times daily with a meal.   Cholecalciferol (VITAMIN D3 PO) Take 1 tablet by mouth daily at 6 (six) AM.   denosumab (PROLIA) 60 MG/ML SOLN injection Inject 60 mg into the skin every 6 (six) months. Administer in  upper arm, thigh, or abdomen   hydrochlorothiazide (HYDRODIURIL) 25 MG tablet Take 1 tablet (25 mg total) by mouth daily. SCHEDULE APPT FOR FUTURE REFILLS   lisinopril (ZESTRIL) 20 MG tablet TAKE 1 TABLET DAILY. PLEASEMAKE AN APPOINTMENT FOR    FURTHER REFILLS (1 MONTH   GIVEN UNTIL APPOINTMENT)   Multiple Vitamin (MULTIVITAMIN) tablet Take 1 tablet by mouth daily.   Multiple Vitamins-Minerals (OCUVITE ADULT 50+ PO) Take by mouth.   rosuvastatin (CRESTOR) 20 MG tablet TAKE 1 TABLET DAILY   SYNTHROID 137 MCG tablet TAKE 1 TABLET (137 MCG TOTAL) BY MOUTH DAILY BEFORE BREAKFAST.   triamcinolone ointment (KENALOG) 0.5 % APPLY 1 APPLICATION        TOPICALLY TWO TIMES A DAY   TURMERIC PO Take 800 mg by mouth daily at 6 (six) AM.   No facility-administered encounter medications on file as of 08/13/2022.    Allergies (verified) Erythromycin, Penicillins, and Thyroid hormones   History: Past Medical History:  Diagnosis Date   Arthritis    bilateral knees   Cataract    not a surgical candidate at this time (05/22/2022)   Depression    GERD (gastroesophageal reflux disease)    Hyperlipidemia    on meds   Hypertension    on meds   Osteoporosis    on meds   Thyroid disease    on meds   Past Surgical History:  Procedure Laterality Date   APPENDECTOMY  1971   COLONOSCOPY  2016   MS-suprep(exc)-tics/TA x2  POLYPECTOMY  3536   TA x 2   UMBILICAL HERNIA REPAIR  2011   Family History  Problem Relation Age of Onset   Hyperlipidemia Mother    Heart disease Mother    Macular degeneration Mother    Dementia Mother    Colon polyps Father 64   Colon cancer Father 23   Colon polyps Maternal Grandfather 3   Colon cancer Maternal Grandfather 101   Breast cancer Paternal Grandmother    Esophageal cancer Neg Hx    Stomach cancer Neg Hx    Rectal cancer Neg Hx    Social History   Socioeconomic History   Marital status: Married    Spouse name: Not on file   Number of children: Not on  file   Years of education: Not on file   Highest education level: Not on file  Occupational History   Not on file  Tobacco Use   Smoking status: Never   Smokeless tobacco: Never  Vaping Use   Vaping Use: Never used  Substance and Sexual Activity   Alcohol use: No    Alcohol/week: 0.0 standard drinks of alcohol   Drug use: No   Sexual activity: Not on file  Other Topics Concern   Not on file  Social History Narrative   Not on file   Social Determinants of Health   Financial Resource Strain: Low Risk  (08/13/2022)   Overall Financial Resource Strain (CARDIA)    Difficulty of Paying Living Expenses: Not hard at all  Food Insecurity: No Food Insecurity (08/13/2022)   Hunger Vital Sign    Worried About Running Out of Food in the Last Year: Never true    Ran Out of Food in the Last Year: Never true  Transportation Needs: No Transportation Needs (08/13/2022)   PRAPARE - Hydrologist (Medical): No    Lack of Transportation (Non-Medical): No  Physical Activity: Inactive (08/13/2022)   Exercise Vital Sign    Days of Exercise per Week: 0 days    Minutes of Exercise per Session: 0 min  Stress: No Stress Concern Present (08/13/2022)   [place]    Feeling of Stress : Not at all  Social Connections: [place] (08/13/2022)   Social Connection and Isolation Panel [NHANES]    Frequency of Communication with Friends and Family: More than three times a week    Frequency of Social Gatherings with Friends and Family: More than three times a week    Attends Religious Services: More than 4 times per year    Active Member of [employer] or Organizations: Yes    Attends Music therapist: More than 4 times per year    Marital Status: Married    Tobacco Counseling Counseling given: Not Answered   Clinical Intake:  Pre-visit preparation completed: No  Pain : No/denies pain      BMI - recorded: 41.36 Nutritional Status: BMI > 30  Obese Nutritional Risks: None Diabetes: No  How often do you need to have someone help you when you read instructions, pamphlets, or other written materials from your doctor or pharmacy?: 1 - Never  Diabetic?  No  Interpreter Needed?: No  Information entered by :: [name] LPN   Activities of Daily Living    08/13/2022    1:43 PM  In your present state of health, do you have any difficulty performing the following activities:  Hearing? 0  Vision? 0  Difficulty concentrating or making decisions? 0  Walking or climbing stairs? 0  Dressing or bathing? 0  Doing errands, shopping? 0  Preparing Food and eating ? N  Using the Toilet? N  In the past six months, have you accidently leaked urine? N  Do you have problems with loss of bowel control? N  Managing your Medications? N  Managing your Finances? N  Housekeeping or managing your Housekeeping? N    Patient Care Team: Dorothyann Peng, NP as PCP - General (Family Medicine)  Indicate any recent Medical Services you may have received from other than [facility] providers in the past year (date may be approximate).     Assessment:   This is a routine wellness examination for [name].  Hearing/Vision screen Hearing Screening - Comments:: Denies hearing difficulties   Vision Screening - Comments:: Wears rx glasses - up to date with routine eye exams with  [place] issues and exercise activities discussed: Exercise limited by: None identified   Goals Addressed               This Visit's Progress     Stay healthy (pt-stated)        Finish my projects       Depression Screen    08/13/2022   11:46 AM 07/10/2021    9:04 AM 07/10/2021    9:02 AM 07/10/2016    8:32 AM  PHQ 2/9 Scores  PHQ - 2 Score 0 0 0 0    Fall Risk    08/13/2022    1:44 PM 07/18/2021    9:45 AM 07/10/2021    9:03 AM  [place] in the past year? 0 0 0  Number falls in  past yr: 0 0 0  Injury with Fall? 0 0 0  Risk for fall due to : No Fall Risks    Follow up Falls prevention discussed Falls evaluation completed Falls evaluation completed    [place]:  Any stairs in or around the home? No  If so, are there any without handrails? No  Home free of loose throw rugs in walkways, pet beds, electrical cords, etc? Yes  Adequate lighting in your home to reduce risk of falls? Yes   ASSISTIVE DEVICES UTILIZED TO PREVENT FALLS:  Life alert? No  Use of a cane, walker or w/c? No  Grab bars in the bathroom? Yes  Shower chair or bench in shower? No  Elevated toilet seat or a handicapped toilet? Yes   TIMED UP AND GO:  Was the test performed? No . Audio Visit    Cognitive Function:        08/13/2022    1:45 PM  6CIT Screen  What Year? 0 points  What month? 0 points  What time? 0 points  Count back from 20 0 points  Months in reverse 0 points  Repeat phrase 0 points  Total Score 0 points    Immunizations Immunization History  Administered Date(s) Administered   Fluad Quad(high Dose 65+) 10/07/2019, 10/11/2020   Influenza Split 07/08/2012   Influenza Whole 08/18/2007   Influenza, High Dose Seasonal PF 07/12/2017, 07/07/2018   Influenza,inj,Quad PF,6+ Mos 08/12/2013, 07/08/2015   PFIZER(Purple Top)SARS-COV-2 Vaccination 12/16/2019, 01/13/2020   Pneumococcal Conjugate-13 07/12/2017   Pneumococcal Polysaccharide-23 07/18/2018   Td 10/09/1995, 07/03/2010   Tdap 07/25/2012    TDAP status: Due, Education has been provided regarding the importance of this vaccine. Advised may receive this  vaccine at local pharmacy or Health Dept. Aware to provide a copy of the vaccination record if obtained from local pharmacy or Health Dept. Verbalized acceptance and understanding.  Flu Vaccine status: Up to date  Pneumococcal vaccine status: Up to date  Covid-19 vaccine status: Completed vaccines  Qualifies for Shingles  Vaccine? Yes   Zostavax completed No   Shingrix Completed?: No.    Education has been provided regarding the importance of this vaccine. Patient has been advised to call insurance company to determine out of pocket expense if they have not yet received this vaccine. Advised may also receive vaccine at local pharmacy or Health Dept. Verbalized acceptance and understanding.  Screening Tests Health Maintenance  Topic Date Due   COVID-19 Vaccine (3 - Pfizer series) 08/29/2022 (Originally 03/09/2020)   Zoster Vaccines- Shingrix (1 of 2) 11/13/2022 (Originally 12/02/2001)   INFLUENZA VACCINE  01/06/2023 (Originally 05/08/2022)   TETANUS/TDAP  08/14/2023 (Originally 07/25/2022)   MAMMOGRAM  06/06/2023   Medicare Annual Wellness (AWV)  08/14/2023   COLONOSCOPY (Pts 45-98yr Insurance coverage will need to be confirmed)  06/13/2027   Pneumonia Vaccine 70 Years old  Completed   DEXA SCAN  Completed   Hepatitis C Screening  Completed   HPV VACCINES  Aged Out    Health Maintenance  There are no preventive care reminders to display for this patient.   Colorectal cancer screening: Type of screening: Colonoscopy. Completed 06/12/22. Repeat every 5 years  Mammogram status: Completed 07/06/22. Repeat every year  Bone Density status: Completed 05/05/21. Results reflect: Bone density results: OSTEOPOROSIS. Repeat every   years.  Lung Cancer Screening: (Low Dose CT Chest recommended if Age 70-80years, 30 pack-year currently smoking OR have quit w/in 15years.) does not qualify.   Additional Screening:  Hepatitis C Screening: does qualify; Completed 07/18/18  Vision Screening: Recommended annual ophthalmology exams for early detection of glaucoma and other disorders of the eye. Is the patient up to date with their annual eye exam?  Yes  Who is the provider or what is the name of the office in which the patient attends annual eye exams[facility] not [facility] with a provider, would they like  to be referred to a provider to establish care? No .   Dental Screening: Recommended annual dental exams for proper oral hygiene  Community Resource Referral / Chronic Care Management:  CRR required this visit?  No   CCM required this visit?  No      Plan:     I have personally reviewed and noted the following in the patient's chart:   Medical and social history Use of alcohol, tobacco or illicit drugs  Current medications and supplements including opioid prescriptions. Patient is not currently taking opioid prescriptions. Functional ability and status Nutritional status Physical activity Advanced directives List of other physicians Hospitalizations, surgeries, and ER visits in previous 12 months Vitals Screenings to include cognitive, depression, and falls Referrals and appointments  In addition, I have reviewed and discussed with patient certain preventive protocols, quality metrics, and best practice recommendations. A written personalized care plan for preventive services as well as general preventive health recommendations were provided to patient.     BCriselda Peaches LPN   [id number]  Nurse Notes: None

## 2022-08-13 NOTE — Patient Instructions (Signed)
Visit Information  Thank you for taking time to visit with me today. Please don't hesitate to contact me if I can be of assistance to you.   Following are the goals we discussed today:   Goals Addressed             This Visit's Progress    COMPLETED: No needs       Care Coordination Interventions: Reviewed medications with patient and discussed adherence with all prescribed medications Reviewed scheduled/upcoming provider appointments including pending appointments Screening for signs and symptoms of depression related to chronic disease state  Assessed social determinant of health barriers          Please call the care guide team at [phone] if you need to cancel or reschedule your appointment.   If you are experiencing a Mental Health or [place] or need someone to talk to, please call the Suicide and Crisis Lifeline: 988 call the [place] National Suicide Prevention Lifeline: 8434329520 or TTY: 385-885-5272 TTY (817)765-5093) to talk to a trained counselor call 1-800-273-TALK (toll free, 24 hour hotline)  Patient verbalizes understanding of instructions and care plan provided today and agrees to view in [place]. Active MyChart status and patient understanding of how to access instructions and care plan via MyChart confirmed with patient.     No further follow up required: No needs   Raina Mina, RN Care Management Coordinator [place] Office [phone]

## 2022-08-13 NOTE — Patient Outreach (Signed)
  Care Coordination   Initial Visit Note   08/13/2022 Name: CAPITOLA LADSON MRN: [id number] DOB: 1950/10/25  DEAZIA LAMPI is a 71 y.o. year old female who sees Nafziger, Tommi Rumps, NP for primary care. I spoke with  Richardean Canal by phone today.  What matters to the patients health and wellness today?  No needs    Goals Addressed             This Visit's Progress    COMPLETED: No needs       Care Coordination Interventions: Reviewed medications with patient and discussed adherence with all prescribed medications Reviewed scheduled/upcoming provider appointments including pending appointments Screening for signs and symptoms of depression related to chronic disease state  Assessed social determinant of health barriers          SDOH assessments and interventions completed:  Yes  SDOH Interventions Today    Flowsheet Row Most Recent Value  SDOH Interventions   Food Insecurity Interventions Intervention Not Indicated  Housing Interventions Intervention Not Indicated  Transportation Interventions Intervention Not Indicated  Utilities Interventions Intervention Not Indicated        Care Coordination Interventions Activated:  Yes  Care Coordination Interventions:  Yes, provided   Follow up plan: No further intervention required.   Encounter Outcome:  Pt. Visit Completed   Raina Mina, RN Care Management Coordinator [place] Office [phone]

## 2022-08-15 ENCOUNTER — Other Ambulatory Visit: Payer: Self-pay | Admitting: Adult Health

## 2022-08-15 DIAGNOSIS — E039 Hypothyroidism, unspecified: Secondary | ICD-10-CM

## 2022-08-15 NOTE — Telephone Encounter (Signed)
Pt needs a visit for further refills  

## 2022-09-20 ENCOUNTER — Other Ambulatory Visit: Payer: Self-pay | Admitting: Adult Health

## 2022-09-20 DIAGNOSIS — E039 Hypothyroidism, unspecified: Secondary | ICD-10-CM

## 2022-09-21 NOTE — Telephone Encounter (Signed)
30 days sent in pt needs an appt.

## 2022-10-24 ENCOUNTER — Other Ambulatory Visit: Payer: Self-pay | Admitting: Adult Health

## 2022-10-24 DIAGNOSIS — E039 Hypothyroidism, unspecified: Secondary | ICD-10-CM

## 2022-11-13 ENCOUNTER — Telehealth: Payer: Self-pay | Admitting: Adult Health

## 2022-11-13 NOTE — Telephone Encounter (Signed)
Patient request Prolia

## 2022-11-14 NOTE — Telephone Encounter (Signed)
Pt has been scheduled for Prolia injection.

## 2022-11-14 NOTE — Telephone Encounter (Signed)
Talk with [name] to sch, Pt has an appt tomorrow

## 2022-11-15 ENCOUNTER — Ambulatory Visit (INDEPENDENT_AMBULATORY_CARE_PROVIDER_SITE_OTHER): Payer: Medicare Other

## 2022-11-15 DIAGNOSIS — M810 Age-related osteoporosis without current pathological fracture: Secondary | ICD-10-CM | POA: Diagnosis not present

## 2022-11-15 MED ORDER — DENOSUMAB 60 MG/ML ~~LOC~~ SOSY
60.0000 mg | PREFILLED_SYRINGE | Freq: Once | SUBCUTANEOUS | Status: AC
  Administered 2022-11-15: 60 mg via SUBCUTANEOUS

## 2022-11-15 NOTE — Patient Instructions (Signed)
Health Maintenance Due  Topic Date Due   Zoster Vaccines- Shingrix (1 of 2) Never done   COVID-19 Vaccine (3 - 2023-24 season) 06/08/2022   DTaP/Tdap/Td (4 - Td or Tdap) 07/25/2022      Row Labels 08/13/2022   11:46 AM 07/10/2021    9:04 AM 07/10/2021    9:02 AM  Depression screen PHQ 2/9   Section Header. No data exists in this row.     Decreased Interest   0 0 0  Down, Depressed, Hopeless   0 0 0  PHQ - 2 Score   0 0 0

## 2022-11-15 NOTE — Progress Notes (Signed)
Per orders of [employer], Np, injection of Prolia given by Franco Collet. Patient tolerated injection well.   Insurance verified by Modena Nunnery

## 2022-11-24 ENCOUNTER — Other Ambulatory Visit: Payer: Self-pay | Admitting: Adult Health

## 2022-11-24 DIAGNOSIS — E039 Hypothyroidism, unspecified: Secondary | ICD-10-CM

## 2022-11-27 NOTE — Telephone Encounter (Signed)
Patient need to schedule an ov for more refills. 

## 2022-11-28 ENCOUNTER — Other Ambulatory Visit: Payer: Self-pay | Admitting: Adult Health

## 2022-11-28 DIAGNOSIS — E039 Hypothyroidism, unspecified: Secondary | ICD-10-CM

## 2022-11-29 NOTE — Telephone Encounter (Signed)
Patient need to schedule an ov for more refills. 

## 2022-12-06 ENCOUNTER — Ambulatory Visit (INDEPENDENT_AMBULATORY_CARE_PROVIDER_SITE_OTHER): Payer: Medicare Other | Admitting: Adult Health

## 2022-12-06 ENCOUNTER — Encounter: Payer: Self-pay | Admitting: Adult Health

## 2022-12-06 VITALS — BP 110/62 | HR 88 | Temp 98.4°F | Ht 66.0 in | Wt 263.0 lb

## 2022-12-06 DIAGNOSIS — M810 Age-related osteoporosis without current pathological fracture: Secondary | ICD-10-CM | POA: Diagnosis not present

## 2022-12-06 DIAGNOSIS — E039 Hypothyroidism, unspecified: Secondary | ICD-10-CM | POA: Diagnosis not present

## 2022-12-06 DIAGNOSIS — E785 Hyperlipidemia, unspecified: Secondary | ICD-10-CM | POA: Diagnosis not present

## 2022-12-06 DIAGNOSIS — I10 Essential (primary) hypertension: Secondary | ICD-10-CM

## 2022-12-06 MED ORDER — HYDROCHLOROTHIAZIDE 25 MG PO TABS: 25.0000 mg | ORAL_TABLET | Freq: Every day | ORAL | 3 refills | Status: DC

## 2022-12-06 MED ORDER — LISINOPRIL 20 MG PO TABS: ORAL_TABLET | ORAL | 3 refills | Status: DC

## 2022-12-06 MED ORDER — SYNTHROID 137 MCG PO TABS: ORAL_TABLET | ORAL | 0 refills | Status: DC

## 2022-12-06 NOTE — Patient Instructions (Signed)
It was great seeing you today   We will follow up with you regarding your lab work   Please let me know if you need anything   Please schedule labs towards the end of next week

## 2022-12-06 NOTE — Progress Notes (Signed)
Subjective:    Patient ID: [name], female    DOB: Sep 14, 1951, 72 y.o.   MRN: [id number]  HPI Patient presents for yearly preventative medicine examination. She is a pleasant 72 year old female who  has a past medical history of Arthritis, Cataract, Depression, GERD (gastroesophageal reflux disease), Hyperlipidemia, Hypertension, Osteoporosis, and Thyroid disease.  Essential Hypertension -well-controlled with lisinopril 20 mg and HCTZ 25 mg daily.  She denies dizziness, lightheadedness, chest pain, or shortness of breath BP Readings from Last 3 Encounters:  12/06/22 110/62  06/12/22 100/67  07/10/21 122/74   Hypothyroidism - takes Synthroid 137 mcg daily.  Lab Results  Component Value Date   TSH 0.49 04/17/2021   Osteoporosis - does Prolia injections. Last Dexa scan in 04/2021 .The BMD measured at [facility] showed a T score of -1.2 which improved from 2019 with a T-score of -2.5  Hyperlipidemia - takes Crestor 20 mg daily. Denies myalgia or fatigue  Lab Results  Component Value Date   CHOL 122 03/14/2021   HDL 50.60 03/14/2021   LDLCALC 50 03/14/2021   LDLDIRECT 114.8 06/23/2009   TRIG 110.0 03/14/2021   CHOLHDL 2 03/14/2021   All immunizations and health maintenance protocols were reviewed with the patient and needed orders were placed. Advised of shingles and tdap at pharmacy   Appropriate screening laboratory values were ordered for the patient including screening of hyperlipidemia, renal function and hepatic function.  Medication reconciliation,  past medical history, social history, problem list and allergies were reviewed in detail with the patient  Goals were established with regard to weight loss, exercise, and  diet in compliance with medications Wt Readings from Last 3 Encounters:  12/06/22 263 lb (119.3 kg)  08/13/22 255 lb (115.7 kg)  06/12/22 251 lb (113.9 kg)    She is up to date on routine colon cancer   Review of Systems  Constitutional:  Negative.   HENT: Negative.    Eyes: Negative.   Respiratory: Negative.    Cardiovascular: Negative.   Gastrointestinal: Negative.   Endocrine: Negative.   Genitourinary: Negative.   Musculoskeletal: Negative.   Skin: Negative.   Allergic/Immunologic: Negative.   Neurological: Negative.   Hematological: Negative.   Psychiatric/Behavioral: Negative.     Past Medical History:  Diagnosis Date   Arthritis    bilateral knees   Cataract    not a surgical candidate at this time (05/22/2022)   Depression    GERD (gastroesophageal reflux disease)    Hyperlipidemia    on meds   Hypertension    on meds   Osteoporosis    on meds   Thyroid disease    on meds    Social History   Socioeconomic History   Marital status: Married    Spouse name: Not on file   Number of children: Not on file   Years of education: Not on file   Highest education level: Bachelor's degree (e.g., BA, AB, BS)  Occupational History   Not on file  Tobacco Use   Smoking status: Never   Smokeless tobacco: Never  Vaping Use   Vaping Use: Never used  Substance and Sexual Activity   Alcohol use: No    Alcohol/week: 0.0 standard drinks of alcohol   Drug use: No   Sexual activity: Not on file  Other Topics Concern   Not on file  Social History Narrative   Not on file   Social Determinants of Health   Financial Resource Strain: Low  Risk  (12/03/2022)   Overall Financial Resource Strain (CARDIA)    Difficulty of Paying Living Expenses: Not hard at all  Food Insecurity: No Food Insecurity (12/03/2022)   Hunger Vital Sign    Worried About Running Out of Food in the Last Year: Never true    Ran Out of Food in the Last Year: Never true  Transportation Needs: No Transportation Needs (12/03/2022)   PRAPARE - Hydrologist (Medical): No    Lack of Transportation (Non-Medical): No  Physical Activity: Inactive (12/03/2022)   Exercise Vital Sign    Days of Exercise per Week: 0 days     Minutes of Exercise per Session: 0 min  Stress: No Stress Concern Present (12/03/2022)   [place]    Feeling of Stress : Not at all  Social Connections: [place] (12/03/2022)   Social Connection and Isolation Panel [NHANES]    Frequency of Communication with Friends and Family: More than three times a week    Frequency of Social Gatherings with Friends and Family: More than three times a week    Attends Religious Services: More than 4 times per year    Active Member of [employer] or Organizations: Yes    Attends Archivist Meetings: More than 4 times per year    Marital Status: Married  Human resources officer Violence: Not At Risk (08/13/2022)   Humiliation, Afraid, Rape, and Kick questionnaire    Fear of Current or Ex-Partner: No    Emotionally Abused: No    Physically Abused: No    Sexually Abused: No    Past Surgical History:  Procedure Laterality Date   APPENDECTOMY  1971   COLONOSCOPY  2016   MS-suprep(exc)-tics/TA x2   POLYPECTOMY  [id number]   TA x 2   UMBILICAL HERNIA REPAIR  2011    Family History  Problem Relation Age of Onset   Hyperlipidemia Mother    Heart disease Mother    Macular degeneration Mother    Dementia Mother    Colon polyps Father 55   Colon cancer Father 57   Colon polyps Maternal Grandfather 39   Colon cancer Maternal Grandfather 16   Breast cancer Paternal Grandmother    Esophageal cancer Neg Hx    Stomach cancer Neg Hx    Rectal cancer Neg Hx     Allergies  Allergen Reactions   Erythromycin     REACTION: Rash   Penicillins     REACTION: Hives   Thyroid Hormones Hives    Armor Thyroid    Current Outpatient Medications on File Prior to Visit  Medication Sig Dispense Refill   calcium carbonate (OS-CAL) 600 MG TABS Take 600 mg by mouth 2 (two) times daily with a meal.     denosumab (PROLIA) 60 MG/ML SOLN injection Inject 60 mg into the skin every 6 (six) months.  Administer in upper arm, thigh, or abdomen 1 mL 0   hydrochlorothiazide (HYDRODIURIL) 25 MG tablet Take 1 tablet (25 mg total) by mouth daily. SCHEDULE APPT FOR FUTURE REFILLS 30 tablet 0   lisinopril (ZESTRIL) 20 MG tablet TAKE 1 TABLET DAILY. PLEASEMAKE AN APPOINTMENT FOR    FURTHER REFILLS (1 MONTH   GIVEN UNTIL APPOINTMENT) 30 tablet 0   Multiple Vitamin (MULTIVITAMIN) tablet Take 1 tablet by mouth daily.     Multiple Vitamins-Minerals (OCUVITE ADULT 50+ PO) Take by mouth.     rosuvastatin (CRESTOR) 20 MG tablet TAKE  1 TABLET DAILY 90 tablet 1   SYNTHROID 137 MCG tablet TAKE 1 TABLET (137 MCG TOTAL) BY MOUTH DAILY BEFORE BREAKFAST. 30 tablet 0   triamcinolone ointment (KENALOG) 0.5 % APPLY 1 APPLICATION        TOPICALLY TWO TIMES A DAY 30 g 3   TURMERIC PO Take 800 mg by mouth daily at 6 (six) AM.     No current facility-administered medications on file prior to visit.    BP 110/62   Pulse 88   Temp 98.4 F (36.9 C) (Oral)   Ht '5\' 6"'$  (1.676 m)   Wt 263 lb (119.3 kg)   SpO2 99%   BMI 42.45 kg/m       Objective:   Physical Exam Vitals and nursing note reviewed.  Constitutional:      Appearance: Normal appearance.  Cardiovascular:     Rate and Rhythm: Normal rate and regular rhythm.     Pulses: Normal pulses.     Heart sounds: Normal heart sounds.  Pulmonary:     Effort: Pulmonary effort is normal.     Breath sounds: Normal breath sounds.  Musculoskeletal:        General: Normal range of motion.  Skin:    General: Skin is warm and dry.     Capillary Refill: Capillary refill takes less than 2 seconds.  Neurological:     General: No focal deficit present.     Mental Status: She is alert and oriented to person, place, and time.  Psychiatric:        Mood and Affect: Mood normal.        Behavior: Behavior normal.        Thought Content: Thought content normal.        Judgment: Judgment normal.       Assessment & Plan:   1. Essential hypertension - Well controlled.  No change in medication  - hydrochlorothiazide (HYDRODIURIL) 25 MG tablet; Take 1 tablet (25 mg total) by mouth daily. SCHEDULE APPT FOR FUTURE REFILLS  Dispense: 90 tablet; Refill: 3 - lisinopril (ZESTRIL) 20 MG tablet; TAKE 1 TABLET DAILY.  Dispense: 90 tablet; Refill: 3 - CBC with Differential/Platelet; Future - Comprehensive metabolic panel; Future - Lipid panel; Future - TSH; Future  2. Hypothyroidism, unspecified type  - SYNTHROID 137 MCG tablet; TAKE 1 TABLET (137 MCG TOTAL) BY MOUTH DAILY BEFORE BREAKFAST.  Dispense: 30 tablet; Refill: 0 - CBC with Differential/Platelet; Future - Comprehensive metabolic panel; Future - Lipid panel; Future - TSH; Future  3. Hyperlipidemia, unspecified hyperlipidemia type - Consider increase in statin  - CBC with Differential/Platelet; Future - Comprehensive metabolic panel; Future - Lipid panel; Future - TSH; Future  4. Age-related osteoporosis without current pathological fracture - VITAMIN D 25 Hydroxy (Vit-D Deficiency, Fractures); Future - Continue with Prolia   Dorothyann Peng, NP

## 2022-12-14 ENCOUNTER — Other Ambulatory Visit (INDEPENDENT_AMBULATORY_CARE_PROVIDER_SITE_OTHER): Payer: Medicare Other

## 2022-12-14 DIAGNOSIS — E785 Hyperlipidemia, unspecified: Secondary | ICD-10-CM | POA: Diagnosis not present

## 2022-12-14 DIAGNOSIS — M810 Age-related osteoporosis without current pathological fracture: Secondary | ICD-10-CM

## 2022-12-14 DIAGNOSIS — I10 Essential (primary) hypertension: Secondary | ICD-10-CM

## 2022-12-14 DIAGNOSIS — E039 Hypothyroidism, unspecified: Secondary | ICD-10-CM | POA: Diagnosis not present

## 2022-12-14 LAB — LIPID PANEL
Cholesterol: 127 mg/dL (ref 0–200)
HDL: 58.3 mg/dL (ref >39.00)
LDL Cholesterol: 47 mg/dL (ref 0–99)
NonHDL: 69.04
Total CHOL/HDL Ratio: 2
Triglycerides: 112 mg/dL (ref 0.0–149.0)
VLDL: 22.4 mg/dL (ref 0.0–40.0)

## 2022-12-14 LAB — COMPREHENSIVE METABOLIC PANEL
ALT: 21 U/L (ref 0–35)
AST: 22 U/L (ref 0–37)
Albumin: 4 g/dL (ref 3.5–5.2)
Alkaline Phosphatase: 75 U/L (ref 39–117)
BUN: 14 mg/dL (ref 6–23)
CO2: 31 mEq/L (ref 19–32)
Calcium: 10.1 mg/dL (ref 8.4–10.5)
Chloride: 103 mEq/L (ref 96–112)
Creatinine, Ser: 0.8 mg/dL (ref 0.40–1.20)
GFR: 74.33 mL/min (ref >60.00)
Glucose, Bld: 92 mg/dL (ref 70–99)
Potassium: 4.3 mEq/L (ref 3.5–5.1)
Sodium: 142 mEq/L (ref 135–145)
Total Bilirubin: 0.7 mg/dL (ref 0.2–1.2)
Total Protein: 7.2 g/dL (ref 6.0–8.3)

## 2022-12-14 LAB — CBC WITH DIFFERENTIAL/PLATELET
Basophils Absolute: 0 10*3/uL (ref 0.0–0.1)
Basophils Relative: 0.4 % (ref 0.0–3.0)
Eosinophils Absolute: 0.1 10*3/uL (ref 0.0–0.7)
Eosinophils Relative: 2.1 % (ref 0.0–5.0)
HCT: 43.1 % (ref 36.0–46.0)
Hemoglobin: 14.5 g/dL (ref 12.0–15.0)
Lymphocytes Relative: 28.9 % (ref 12.0–46.0)
Lymphs Abs: 1.4 10*3/uL (ref 0.7–4.0)
MCHC: 33.6 g/dL (ref 30.0–36.0)
MCV: 91.2 fl (ref 78.0–100.0)
Monocytes Absolute: 0.5 10*3/uL (ref 0.1–1.0)
Monocytes Relative: 10.4 % (ref 3.0–12.0)
Neutro Abs: 2.9 10*3/uL (ref 1.4–7.7)
Neutrophils Relative %: 58.2 % (ref 43.0–77.0)
Platelets: 313 10*3/uL (ref 150.0–400.0)
RBC: 4.72 Mil/uL (ref 3.87–5.11)
RDW: 13.7 % (ref 11.5–15.5)
WBC: 5 10*3/uL (ref 4.0–10.5)

## 2022-12-15 LAB — TSH: TSH: 0.93 u[IU]/mL (ref 0.35–5.50)

## 2022-12-15 LAB — VITAMIN D 25 HYDROXY (VIT D DEFICIENCY, FRACTURES): VITD: 69.72 ng/mL (ref 30.00–100.00)

## 2022-12-18 ENCOUNTER — Other Ambulatory Visit: Payer: Self-pay | Admitting: Adult Health

## 2022-12-18 DIAGNOSIS — E039 Hypothyroidism, unspecified: Secondary | ICD-10-CM

## 2022-12-18 DIAGNOSIS — E785 Hyperlipidemia, unspecified: Secondary | ICD-10-CM

## 2022-12-18 MED ORDER — ROSUVASTATIN CALCIUM 20 MG PO TABS: 20.0000 mg | ORAL_TABLET | Freq: Every day | ORAL | 3 refills | Status: DC

## 2022-12-18 MED ORDER — SYNTHROID 137 MCG PO TABS: ORAL_TABLET | ORAL | 3 refills | Status: DC

## 2023-05-21 ENCOUNTER — Telehealth: Payer: Self-pay

## 2023-05-21 NOTE — Telephone Encounter (Signed)
I called patient's home number, left a message with the person who answered for patient to call back to schedule her injection once she is back in town.

## 2023-05-21 NOTE — Telephone Encounter (Signed)
Pt ready for scheduling on or after 05/16/2023.  Estimated out-of-pocket cost due at time of visit: $0  Primary Insurance: Medicare Prolia co-insurance: 0%  Secondary Insurance: [employer]  Deductible: $240 out of $240 met  Eligible for co-pay program: No  Prior Auth: N/A PA#:  Valid:   This summary of benefits is an estimation of the patient's out-of-pocket cost. Exact cost may vary based on individual plan coverage.

## 2023-05-31 ENCOUNTER — Ambulatory Visit (INDEPENDENT_AMBULATORY_CARE_PROVIDER_SITE_OTHER): Payer: Medicare Other

## 2023-05-31 DIAGNOSIS — M810 Age-related osteoporosis without current pathological fracture: Secondary | ICD-10-CM | POA: Diagnosis not present

## 2023-05-31 MED ORDER — DENOSUMAB 60 MG/ML ~~LOC~~ SOSY
60.0000 mg | PREFILLED_SYRINGE | Freq: Once | SUBCUTANEOUS | Status: AC
  Administered 2023-05-31: 60 mg via SUBCUTANEOUS

## 2023-05-31 NOTE — Progress Notes (Signed)
Per orders of Shirline Frees, NP, injection of Prolia  given by Vickii Chafe on L arm. Patient tolerated injection well.

## 2023-06-04 ENCOUNTER — Other Ambulatory Visit: Payer: Self-pay | Admitting: Adult Health

## 2023-06-04 DIAGNOSIS — Z1231 Encounter for screening mammogram for malignant neoplasm of breast: Secondary | ICD-10-CM

## 2023-06-07 ENCOUNTER — Ambulatory Visit
Admission: RE | Admit: 2023-06-07 | Discharge: 2023-06-07 | Disposition: A | Discharge status: Home / Self Care | Payer: Medicare Other | Source: Ambulatory Visit

## 2023-06-07 DIAGNOSIS — Z1231 Encounter for screening mammogram for malignant neoplasm of breast: Secondary | ICD-10-CM

## 2023-08-22 ENCOUNTER — Ambulatory Visit: Payer: Medicare Other | Admitting: Family Medicine

## 2023-08-22 ENCOUNTER — Encounter: Payer: Self-pay | Admitting: Family Medicine

## 2023-08-22 VITALS — Ht 66.0 in | Wt 263.0 lb

## 2023-08-22 DIAGNOSIS — Z0000 Encounter for general adult medical examination without abnormal findings: Secondary | ICD-10-CM | POA: Diagnosis not present

## 2023-08-22 NOTE — Progress Notes (Signed)
PATIENT CHECK-IN and HEALTH RISK ASSESSMENT QUESTIONNAIRE:  -completed by phone/video for upcoming Medicare Preventive Visit  Pre-Visit Check-in: 1)Vitals (height, wt, BP, etc) - record in vitals section for visit on day of visit Request home vitals (wt, BP, etc.) and enter into vitals, THEN update Vital Signs SmartPhrase below at the top of the HPI. See below.  2)Review and Update Medications, Allergies PMH, Surgeries, Social history in Epic 3)Hospitalizations in the last year with date/reason? No  4)Review and Update Care Team (patient's specialists) in Epic 5) Complete PHQ9 in Epic  6) Complete Fall Screening in Epic 7)Review all Health Maintenance Due and order under PCP if not done.  8)Medicare Wellness Questionnaire: Answer theses question about your habits: Do you drink alcohol? No  If yes, how many drinks do you have a day?n/a  Have you ever smoked?no  Quit date if applicable? N/a  How many packs a day do/did you smoke? N/a Do you use smokeless tobacco?no Do you use an illicit drugs?no Do you exercises? Pt states no formally exercise, physically active in taking care of her chicken, goat daily - milks goats, makes cheese, doing chores daily. Has arthritis in knees and back.  Are you sexually active? Yes Number of partners? 1 Typical breakfast: piece of toast. Hot chocolate or some kind of hot drink.  Typical lunch: left over from supper: chicken, sandwich, apple, salad, tomatoe Typical dinner: varies- chicken, beef, rice, patotoes,  Typical snacks:cheese, crackers, fruits  Beverages: mostly water, soda-"very occasionally"  Answer theses question about you: Can you perform most household chores?yes  Do you find it hard to follow a conversation in a noisy room?no Do you often ask people to speak up or repeat themselves?no Do you feel that you have a problem with memory?no Do you balance your checkbook and or bank acounts?yes Do you feel safe at home?yes Last dentist visit?it  has several years. Has no dental insurance.  Do you need assistance with any of the following: Please note if so No  Driving?  Feeding yourself?  Getting from bed to chair?  Getting to the toilet?  Bathing or showering?  Dressing yourself?  Managing money?  Climbing a flight of stairs  Preparing meals?  Do you have Advanced Directives in place (Living Will, Healthcare Power or Attorney)? Yes   Last eye Exam and location? Was last seen with [facility] in Sept., 2024 in [place]   Do you currently use prescribed or non-prescribed narcotic or opioid pain medications?No  Do you have a history or close family history of breast, ovarian, tubal or peritoneal cancer or a family member with BRCA (breast cancer susceptibility 1 and 2) gene mutations? No  Request home vitals (wt, BP, etc.) and enter into vitals, THEN update Vital Signs SmartPhrase below at the top of the HPI. See below.   Nurse/Assistant Credentials/time stamp: Karpuih Moyun/CMA/12:33pm   ----------------------------------------------------------------------------------------------------------------------------------------------------------------------------------------------------------------------  Because this visit was a virtual/telehealth visit, some criteria may be missing or patient reported. Any vitals not documented were not able to be obtained and vitals that have been documented are patient reported.    MEDICARE ANNUAL PREVENTIVE VISIT WITH PROVIDER: (Welcome to [facility], initial annual wellness or annual wellness exam)  Virtual Visit via Phone Note  I connected with [name] on 08/22/23 by phone and verified that I am speaking with the correct person using two identifiers.  Location patient: home Location provider:work or home office Persons participating in the virtual visit: patient, provider  Concerns and/or follow up today: no  concerns   See HM section in Epic for other details of completed  HM.    ROS: negative for report of fevers, unintentional weight loss, vision changes, vision loss, hearing loss or change, chest pain, sob, hemoptysis, melena, hematochezia, hematuria, falls, bleeding or bruising, thoughts of suicide or self harm, memory loss  Patient-completed extensive health risk assessment - reviewed and discussed with the patient: See Health Risk Assessment completed with patient prior to the visit either above or in recent phone note. This was reviewed in detailed with the patient today and appropriate recommendations, orders and referrals were placed as needed per Summary below and patient instructions.   Review of Medical History: -PMH, PSH, Family History and current specialty and care providers reviewed and updated and listed below   Patient Care Team: Shirline Frees, NP as PCP - General (Family Medicine)   Past Medical History:  Diagnosis Date   Arthritis    bilateral knees   Cataract    not a surgical candidate at this time (05/22/2022)   Depression    GERD (gastroesophageal reflux disease)    Hyperlipidemia    on meds   Hypertension    on meds   Osteoporosis    on meds   Thyroid disease    on meds    Past Surgical History:  Procedure Laterality Date   APPENDECTOMY  1971   COLONOSCOPY  2016   MS-suprep(exc)-tics/TA x2   POLYPECTOMY  2016   TA x 2   UMBILICAL HERNIA REPAIR  2011    Social History   Socioeconomic History   Marital status: Married    Spouse name: Not on file   Number of children: Not on file   Years of education: Not on file   Highest education level: Bachelor's degree (e.g., BA, AB, BS)  Occupational History   Not on file  Tobacco Use   Smoking status: Never   Smokeless tobacco: Never  Vaping Use   Vaping status: Never Used  Substance and Sexual Activity   Alcohol use: No    Alcohol/week: 0.0 standard drinks of alcohol   Drug use: No   Sexual activity: Not on file  Other Topics Concern   Not on file  Social  History Narrative   Not on file   Social Determinants of Health   Financial Resource Strain: Low Risk  (12/03/2022)   Overall Financial Resource Strain (CARDIA)    Difficulty of Paying Living Expenses: Not hard at all  Food Insecurity: No Food Insecurity (12/03/2022)   Hunger Vital Sign    Worried About Running Out of Food in the Last Year: Never true    Ran Out of Food in the Last Year: Never true  Transportation Needs: No Transportation Needs (12/03/2022)   PRAPARE - Administrator, Civil Service (Medical): No    Lack of Transportation (Non-Medical): No  Physical Activity: Unknown (12/03/2022)   Exercise Vital Sign    Days of Exercise per Week: 0 days    Minutes of Exercise per Session: Not on file  Recent Concern: Physical Activity - Inactive (12/03/2022)   Exercise Vital Sign    Days of Exercise per Week: 0 days    Minutes of Exercise per Session: 0 min  Stress: No Stress Concern Present (08/22/2023)   [employer] of Occupational Health - Occupational Stress Questionnaire    Feeling of Stress : Not at all  Social Connections: Socially Integrated (08/22/2023)   Social Connection and Isolation Panel [NHANES]  Frequency of Communication with Friends and Family: More than three times a week    Frequency of Social Gatherings with Friends and Family: Once a week    Attends Religious Services: More than 4 times per year    Active Member of [employer] or Organizations: Yes    Attends Engineer, structural: More than 4 times per year    Marital Status: Married  Catering manager Violence: Not At Risk (08/22/2023)   Humiliation, Afraid, Rape, and Kick questionnaire    Fear of Current or Ex-Partner: No    Emotionally Abused: No    Physically Abused: No    Sexually Abused: No    Family History  Problem Relation Age of Onset   Hyperlipidemia Mother    Heart disease Mother    Macular degeneration Mother    Dementia Mother    Colon polyps Father 27   Colon  cancer Father 64   Colon polyps Maternal Grandfather 14   Colon cancer Maternal Grandfather 4   Breast cancer Paternal Grandmother    Esophageal cancer Neg Hx    Stomach cancer Neg Hx    Rectal cancer Neg Hx     Current Outpatient Medications on File Prior to Visit  Medication Sig Dispense Refill   calcium carbonate (OS-CAL) 600 MG TABS Take 600 mg by mouth 2 (two) times daily with a meal.     denosumab (PROLIA) 60 MG/ML SOLN injection Inject 60 mg into the skin every 6 (six) months. Administer in upper arm, thigh, or abdomen 1 mL 0   hydrochlorothiazide (HYDRODIURIL) 25 MG tablet Take 1 tablet (25 mg total) by mouth daily. SCHEDULE APPT FOR FUTURE REFILLS 90 tablet 3   lisinopril (ZESTRIL) 20 MG tablet TAKE 1 TABLET DAILY. 90 tablet 3   Multiple Vitamin (MULTIVITAMIN) tablet Take 1 tablet by mouth daily.     Multiple Vitamins-Minerals (OCUVITE ADULT 50+ PO) Take by mouth.     rosuvastatin (CRESTOR) 20 MG tablet Take 1 tablet (20 mg total) by mouth daily. 90 tablet 3   SYNTHROID 137 MCG tablet TAKE 1 TABLET (137 MCG TOTAL) BY MOUTH DAILY BEFORE BREAKFAST. 90 tablet 3   triamcinolone ointment (KENALOG) 0.5 % APPLY 1 APPLICATION        TOPICALLY TWO TIMES A DAY 30 g 3   TURMERIC PO Take 800 mg by mouth daily at 6 (six) AM.     No current facility-administered medications on file prior to visit.    Allergies  Allergen Reactions   Erythromycin     REACTION: Rash   Penicillins     REACTION: Hives   Thyroid Hormones Hives    Armor Thyroid       Physical Exam Vitals requested from patient and listed below if patient had equipment and was able to obtain at home for this virtual visit: There were no vitals filed for this visit. Estimated body mass index is 42.45 kg/m as calculated from the following:   Height as of this encounter: 5\' 6"  (1.676 m).   Weight as of this encounter: 263 lb (119.3 kg).  EKG (optional): deferred due to virtual visit  GENERAL: alert, oriented, no acute  distress detected, full vision exam deferred due to pandemic and/or virtual encounter  PSYCH/NEURO: pleasant and cooperative, no obvious depression or anxiety, speech and thought processing grossly intact, Cognitive function grossly intact  [employer] Office Visit from 08/22/2023 in [facility] [place]  PHQ-9 Total Score 0  08/22/2023   12:20 PM 12/06/2022    1:58 PM 08/13/2022   11:46 AM 07/10/2021    9:04 AM 07/10/2021    9:02 AM  Depression screen PHQ 2/9  Decreased Interest 0 0 0 0 0  Down, Depressed, Hopeless 0 0 0 0 0  PHQ - 2 Score 0 0 0 0 0  Altered sleeping 0 1     Tired, decreased energy 0 3     Change in appetite 0 1     Feeling bad or failure about yourself  0 0     Trouble concentrating 0 0     Moving slowly or fidgety/restless 0 0     Suicidal thoughts 0 0     PHQ-9 Score 0 5     Difficult doing work/chores  Not difficult at all          07/18/2021    9:45 AM 08/13/2022    1:44 PM 12/03/2022    5:04 PM 12/06/2022    1:57 PM 08/22/2023   12:20 PM  Fall Risk  Falls in the past year? 0 0 0 0 0  Was there an injury with Fall? 0 0  0 0  Fall Risk Category Calculator 0 0  0 0  Fall Risk Category (Retired) Low Low     (RETIRED) Patient Fall Risk Level Low fall risk Low fall risk     Patient at Risk for Falls Due to  No Fall Risks  No Fall Risks No Fall Risks  Fall risk Follow up Falls evaluation completed Falls prevention discussed  Falls evaluation completed Falls evaluation completed     SUMMARY AND PLAN:  Encounter for Medicare annual wellness exam   Discussed applicable health maintenance/preventive health measures and advised and referred or ordered per patient preferences: Health Maintenance  Topic Date Due   COVID-19 Vaccine (3 - 2023-24 season) 09/07/2023 (Originally 06/09/2023)   Zoster Vaccines- Shingrix (1 of 2) 11/22/2023 (Originally 12/02/2001)   INFLUENZA VACCINE  01/06/2024 (Originally 05/09/2023)    DTaP/Tdap/Td (4 - Td or Tdap) 02/19/2024 (Originally 07/25/2022)   MAMMOGRAM  06/06/2024   Medicare Annual Wellness (AWV)  08/21/2024   Colonoscopy  06/13/2027   Pneumonia Vaccine 48+ Years old  Completed   DEXA SCAN  Completed   Hepatitis C Screening  Completed   HPV VACCINES  Aged [employer] and counseling on the following was provided based on the above review of health and a plan/checklist for the patient, along with additional information discussed, was provided for the patient in the patient instructions :   -Advised and counseled on a healthy lifestyle - including the importance of a healthy diet, regular physical activity, social connections  -Reviewed patient's current diet. Advised and counseled on a whole foods based healthy diet. A summary of a healthy diet was provided in the Patient Instructions.  -reviewed patient's current physical activity level and discussed exercise guidelines for adults. Discussed community resources and ideas for safe exercise at home to assist in meeting exercise guideline recommendations in a safe and healthy way.  -Advise yearly dental visits at minimum and regular eye exams   Follow up: see patient instructions     Patient Instructions  I really enjoyed getting to talk with you today! I am available on Tuesdays and Thursdays for virtual visits if you have any questions or concerns, or if I can be of any further assistance.   CHECKLIST FROM ANNUAL WELLNESS VISIT:  -Follow up (  please call to schedule if not scheduled after visit):   -yearly for annual wellness visit with primary care office  Here is a list of your preventive care/health maintenance measures and the plan for each if any are due:  PLAN For any measures below that may be due:  -if you decide to get the updated covid or flu shots, tetanus booster or shingles vaccines - they are available at the pharmacy - please let us know so that we can update your records  Health  Maintenance  Topic Date Due   COVID-19 Vaccine (3 - 2023-24 season) 09/07/2023 (Originally 06/09/2023)   Zoster Vaccines- Shingrix (1 of 2) 11/22/2023 (Originally 12/02/2001)   INFLUENZA VACCINE  01/06/2024 (Originally 05/09/2023)   DTaP/Tdap/Td (4 - Td or Tdap) 02/19/2024 (Originally 07/25/2022)   MAMMOGRAM  06/06/2024   Medicare Annual Wellness (AWV)  08/21/2024   Colonoscopy  06/13/2027   Pneumonia Vaccine 13+ Years old  Completed   DEXA SCAN  Completed   Hepatitis C Screening  Completed   HPV VACCINES  Aged Out    -See a dentist at least yearly  -Get your eyes checked and then per your eye specialist's recommendations  -Other issues addressed today:   -I have included below further information regarding a healthy whole foods based diet, physical activity guidelines for adults, stress management and opportunities for social connections. I hope you find this information useful.   -----------------------------------------------------------------------------------------------------------------------------------------------------------------------------------------------------------------------------------------------------------  NUTRITION: -eat real food: lots of colorful vegetables (half the plate) and fruits -5-7 servings of vegetables and fruits per day (fresh or steamed is best), exp. 2 servings of vegetables with lunch and dinner and 2 servings of fruit per day. Berries and greens such as kale and collards are great choices.  -consume on a regular basis: whole grains (make sure first ingredient on label contains the word "whole"), fresh fruits, fish, nuts, seeds, healthy oils (such as olive oil, avocado oil, grape seed oil) -may eat small amounts of dairy and lean meat on occasion, but avoid processed meats such as ham, bacon, lunch meat, etc. -drink water -try to avoid fast food and pre-packaged foods, processed meat -most experts advise limiting sodium to < 2300mg  per day, should  limit further is any chronic conditions such as high blood pressure, heart disease, diabetes, etc. The American Heart Association advised that < 1500mg  is is ideal -try to avoid foods that contain any ingredients with names you do not recognize  -try to avoid sugar/sweets (except for the natural sugar that occurs in fresh fruit) -try to avoid sweet drinks -try to avoid white rice, white bread, pasta (unless whole grain), white or yellow potatoes  EXERCISE GUIDELINES FOR ADULTS: -if you wish to increase your physical activity, do so gradually and with the approval of your doctor -STOP and seek medical care immediately if you have any chest pain, chest discomfort or trouble breathing when starting or increasing exercise  -move and stretch your body, legs, feet and arms when sitting for long periods -Physical activity guidelines for optimal health in adults: -least 150 minutes per week of aerobic exercise (can talk, but not sing) once approved by your doctor, 20-30 minutes of sustained activity or two 10 minute episodes of sustained activity every day.  -resistance training at least 2 days per week if approved by your doctor -balance exercises 3+ days per week:   Stand somewhere where you have something sturdy to hold onto if you lose balance.    1) lift up on toes, start  with 5x per day and work up to 20x   2) stand and lift on leg straight out to the side so that foot is a few inches of the floor, start with 5x each side and work up to 20x each side   3) stand on one foot, start with 5 seconds each side and work up to 20 seconds on each side  If you need ideas or help with getting more active:  -Silver sneakers https://tools.silversneakers.com  -Walk with a Doc: http://www.duncan-williams.com/  -try to include resistance (weight lifting/strength building) and balance exercises twice per week: or the following link for  ideas: http://castillo-powell.com/  BuyDucts.dk  STRESS MANAGEMENT: -can try meditating, or just sitting quietly with deep breathing while intentionally relaxing all parts of your body for 5 minutes daily -if you need further help with stress, anxiety or depression please follow up with your primary doctor or contact the wonderful folks at [facility]: [phone]  SOCIAL CONNECTIONS: -options in Welcome if you wish to engage in more social and exercise related activities:  -Silver sneakers https://tools.silversneakers.com  -Walk with a Doc: http://www.duncan-williams.com/  -Check out the [facility] Active Adults 50+ section on the [place] of [employer] (hiking clubs, book clubs, cards and games, chess, exercise classes, aquatic classes and much more) - see the website for details: https://www.[place].gov/departments/parks-recreation/active-adults50  -YouTube has lots of exercise videos for different ages and abilities as well  -[facility] (a variety of indoor and outdoor inperson activities for adults). [phone]. [address]  -Virtual Online Classes (a variety of topics): see seniorplanet.org or call [phone]  -consider volunteering at a school, hospice center, church, senior center or elsewhere           Terressa Koyanagi, DO

## 2023-08-22 NOTE — Patient Instructions (Addendum)
I really enjoyed getting to talk with you today! I am available on Tuesdays and Thursdays for virtual visits if you have any questions or concerns, or if I can be of any further assistance.   CHECKLIST FROM ANNUAL WELLNESS VISIT:  -Follow up (please call to schedule if not scheduled after visit):   -yearly for annual wellness visit with primary care office  Here is a list of your preventive care/health maintenance measures and the plan for each if any are due:  PLAN For any measures below that may be due:  -if you decide to get the updated covid or flu shots, tetanus booster or shingles vaccines - they are available at the pharmacy - please let us know so that we can update your records  Health Maintenance  Topic Date Due   COVID-19 Vaccine (3 - 2023-24 season) 09/07/2023 (Originally 06/09/2023)   Zoster Vaccines- Shingrix (1 of 2) 11/22/2023 (Originally 12/02/2001)   INFLUENZA VACCINE  01/06/2024 (Originally 05/09/2023)   DTaP/Tdap/Td (4 - Td or Tdap) 02/19/2024 (Originally 07/25/2022)   MAMMOGRAM  06/06/2024   Medicare Annual Wellness (AWV)  08/21/2024   Colonoscopy  06/13/2027   Pneumonia Vaccine 89+ Years old  Completed   DEXA SCAN  Completed   Hepatitis C Screening  Completed   HPV VACCINES  Aged Out    -See a dentist at least yearly  -Get your eyes checked and then per your eye specialist's recommendations  -Other issues addressed today:   -I have included below further information regarding a healthy whole foods based diet, physical activity guidelines for adults, stress management and opportunities for social connections. I hope you find this information useful.   -----------------------------------------------------------------------------------------------------------------------------------------------------------------------------------------------------------------------------------------------------------  NUTRITION: -eat real food: lots of colorful vegetables (half  the plate) and fruits -5-7 servings of vegetables and fruits per day (fresh or steamed is best), exp. 2 servings of vegetables with lunch and dinner and 2 servings of fruit per day. Berries and greens such as kale and collards are great choices.  -consume on a regular basis: whole grains (make sure first ingredient on label contains the word "whole"), fresh fruits, fish, nuts, seeds, healthy oils (such as olive oil, avocado oil, grape seed oil) -may eat small amounts of dairy and lean meat on occasion, but avoid processed meats such as ham, bacon, lunch meat, etc. -drink water -try to avoid fast food and pre-packaged foods, processed meat -most experts advise limiting sodium to < 2300mg  per day, should limit further is any chronic conditions such as high blood pressure, heart disease, diabetes, etc. The American Heart Association advised that < 1500mg  is is ideal -try to avoid foods that contain any ingredients with names you do not recognize  -try to avoid sugar/sweets (except for the natural sugar that occurs in fresh fruit) -try to avoid sweet drinks -try to avoid white rice, white bread, pasta (unless whole grain), white or yellow potatoes  EXERCISE GUIDELINES FOR ADULTS: -if you wish to increase your physical activity, do so gradually and with the approval of your doctor -STOP and seek medical care immediately if you have any chest pain, chest discomfort or trouble breathing when starting or increasing exercise  -move and stretch your body, legs, feet and arms when sitting for long periods -Physical activity guidelines for optimal health in adults: -least 150 minutes per week of aerobic exercise (can talk, but not sing) once approved by your doctor, 20-30 minutes of sustained activity or two 10 minute episodes of sustained activity every day.  -  resistance training at least 2 days per week if approved by your doctor -balance exercises 3+ days per week:   Stand somewhere where you have  something sturdy to hold onto if you lose balance.    1) lift up on toes, start with 5x per day and work up to 20x   2) stand and lift on leg straight out to the side so that foot is a few inches of the floor, start with 5x each side and work up to 20x each side   3) stand on one foot, start with 5 seconds each side and work up to 20 seconds on each side  If you need ideas or help with getting more active:  -Silver sneakers https://tools.silversneakers.com  -Walk with a Doc: http://www.duncan-williams.com/  -try to include resistance (weight lifting/strength building) and balance exercises twice per week: or the following link for ideas: http://castillo-powell.com/  BuyDucts.dk  STRESS MANAGEMENT: -can try meditating, or just sitting quietly with deep breathing while intentionally relaxing all parts of your body for 5 minutes daily -if you need further help with stress, anxiety or depression please follow up with your primary doctor or contact the wonderful folks at [facility]: [phone]  SOCIAL CONNECTIONS: -options in [place] if you wish to engage in more social and exercise related activities:  -Silver sneakers https://tools.silversneakers.com  -Walk with a Doc: http://www.duncan-williams.com/  -Check out the [facility] Active Adults 50+ section on the [place] of [employer] (hiking clubs, book clubs, cards and games, chess, exercise classes, aquatic classes and much more) - see the website for details: https://www.-[place].gov/departments/parks-recreation/active-adults50  -YouTube has lots of exercise videos for different ages and abilities as well  -[facility] (a variety of indoor and outdoor inperson activities for adults). [phone]. [address]  -Virtual Online Classes (a variety of topics): see seniorplanet.org or call  [phone]  -consider volunteering at a school, hospice center, church, senior center or elsewhere

## 2023-11-12 ENCOUNTER — Telehealth: Payer: Self-pay

## 2023-11-12 NOTE — Telephone Encounter (Signed)
 Patient notified of update  and verbalized understanding.

## 2023-11-12 NOTE — Telephone Encounter (Signed)
 Pt ready for scheduling on or after 12/01/23  Estimated out-of-pocket cost due at time of visit: $0  Primary Insurance: Medicare  Secondary Insurance: Aetna supplemental  Deductible: $0 out of $257   This summary of benefits is an estimation of the patient's out-of-pocket cost. Exact cost may vary based on individual plan coverage.

## 2023-11-12 NOTE — Telephone Encounter (Signed)
 Pt has been scheduled.

## 2023-12-05 ENCOUNTER — Ambulatory Visit (INDEPENDENT_AMBULATORY_CARE_PROVIDER_SITE_OTHER): Payer: Medicare Other

## 2023-12-05 DIAGNOSIS — M810 Age-related osteoporosis without current pathological fracture: Secondary | ICD-10-CM | POA: Diagnosis not present

## 2023-12-05 MED ORDER — DENOSUMAB 60 MG/ML ~~LOC~~ SOSY
60.0000 mg | PREFILLED_SYRINGE | Freq: Once | SUBCUTANEOUS | Status: AC
  Administered 2023-12-05: 60 mg via SUBCUTANEOUS

## 2023-12-05 NOTE — Progress Notes (Signed)
 Per orders of Shirline Frees, NP, injection of Prolia 60mg /ml  given by Vickii Chafe on Left Arm.   Patient tolerated injection well.

## 2023-12-26 ENCOUNTER — Other Ambulatory Visit: Payer: Self-pay | Admitting: Adult Health

## 2023-12-26 DIAGNOSIS — I10 Essential (primary) hypertension: Secondary | ICD-10-CM

## 2023-12-26 NOTE — Telephone Encounter (Signed)
 Pt needs a Cpe for further refills

## 2024-01-28 ENCOUNTER — Ambulatory Visit: Payer: Self-pay

## 2024-01-28 ENCOUNTER — Ambulatory Visit (INDEPENDENT_AMBULATORY_CARE_PROVIDER_SITE_OTHER): Admitting: Adult Health

## 2024-01-28 VITALS — BP 120/78 | HR 74 | Temp 97.9°F | Ht 66.0 in | Wt 261.0 lb

## 2024-01-28 DIAGNOSIS — E039 Hypothyroidism, unspecified: Secondary | ICD-10-CM

## 2024-01-28 DIAGNOSIS — M810 Age-related osteoporosis without current pathological fracture: Secondary | ICD-10-CM

## 2024-01-28 DIAGNOSIS — E785 Hyperlipidemia, unspecified: Secondary | ICD-10-CM | POA: Diagnosis not present

## 2024-01-28 DIAGNOSIS — I10 Essential (primary) hypertension: Secondary | ICD-10-CM

## 2024-01-28 DIAGNOSIS — M150 Primary generalized (osteo)arthritis: Secondary | ICD-10-CM

## 2024-01-28 LAB — COMPREHENSIVE METABOLIC PANEL WITH GFR
ALT: 20 U/L (ref 0–35)
AST: 22 U/L (ref 0–37)
Albumin: 4.3 g/dL (ref 3.5–5.2)
Alkaline Phosphatase: 69 U/L (ref 39–117)
BUN: 13 mg/dL (ref 6–23)
CO2: 31 meq/L (ref 19–32)
Calcium: 9.6 mg/dL (ref 8.4–10.5)
Chloride: 101 meq/L (ref 96–112)
Creatinine, Ser: 0.66 mg/dL (ref 0.40–1.20)
GFR: 87.8 mL/min (ref >60.00)
Glucose, Bld: 94 mg/dL (ref 70–99)
Potassium: 3.4 meq/L — ABNORMAL LOW (ref 3.5–5.1)
Sodium: 140 meq/L (ref 135–145)
Total Bilirubin: 0.7 mg/dL (ref 0.2–1.2)
Total Protein: 7.5 g/dL (ref 6.0–8.3)

## 2024-01-28 LAB — CBC WITH DIFFERENTIAL/PLATELET
Basophils Absolute: 0 10*3/uL (ref 0.0–0.1)
Basophils Relative: 0.4 % (ref 0.0–3.0)
Eosinophils Absolute: 0.2 10*3/uL (ref 0.0–0.7)
Eosinophils Relative: 3.2 % (ref 0.0–5.0)
HCT: 43.1 % (ref 36.0–46.0)
Hemoglobin: 14.3 g/dL (ref 12.0–15.0)
Lymphocytes Relative: 25 % (ref 12.0–46.0)
Lymphs Abs: 1.5 10*3/uL (ref 0.7–4.0)
MCHC: 33.3 g/dL (ref 30.0–36.0)
MCV: 91.4 fl (ref 78.0–100.0)
Monocytes Absolute: 0.6 10*3/uL (ref 0.1–1.0)
Monocytes Relative: 10.4 % (ref 3.0–12.0)
Neutro Abs: 3.7 10*3/uL (ref 1.4–7.7)
Neutrophils Relative %: 61 % (ref 43.0–77.0)
Platelets: 293 10*3/uL (ref 150.0–400.0)
RBC: 4.71 Mil/uL (ref 3.87–5.11)
RDW: 14.2 % (ref 11.5–15.5)
WBC: 6 10*3/uL (ref 4.0–10.5)

## 2024-01-28 LAB — LIPID PANEL
Cholesterol: 134 mg/dL (ref 0–200)
HDL: 55.8 mg/dL (ref >39.00)
LDL Cholesterol: 55 mg/dL (ref 0–99)
NonHDL: 78.22
Total CHOL/HDL Ratio: 2
Triglycerides: 115 mg/dL (ref 0.0–149.0)
VLDL: 23 mg/dL (ref 0.0–40.0)

## 2024-01-28 LAB — VITAMIN D 25 HYDROXY (VIT D DEFICIENCY, FRACTURES): VITD: 111.88 ng/mL (ref 30.00–100.00)

## 2024-01-28 LAB — TSH: TSH: 0.35 u[IU]/mL (ref 0.35–5.50)

## 2024-01-28 MED ORDER — HYDROCHLOROTHIAZIDE 25 MG PO TABS: 25.0000 mg | ORAL_TABLET | Freq: Every day | ORAL | 3 refills | Status: AC

## 2024-01-28 MED ORDER — ROSUVASTATIN CALCIUM 20 MG PO TABS: 20.0000 mg | ORAL_TABLET | Freq: Every day | ORAL | 3 refills | Status: AC

## 2024-01-28 MED ORDER — SYNTHROID 137 MCG PO TABS: ORAL_TABLET | ORAL | 3 refills | Status: DC

## 2024-01-28 MED ORDER — LISINOPRIL 20 MG PO TABS: ORAL_TABLET | ORAL | 3 refills | Status: AC

## 2024-01-28 MED ORDER — MELOXICAM 15 MG PO TABS: 15.0000 mg | ORAL_TABLET | Freq: Every day | ORAL | 0 refills | Status: AC

## 2024-01-28 NOTE — Patient Instructions (Signed)
It was great seeing you today   We will follow up with you regarding your lab work   Please let me know if you need anything   Schedule your bone density test at check out desk. You may also call directly to X-ray at [phone] to schedule an appointment that is convenient for you.  - located [address] across the street from  - in the basement - you do need an appointment for the bone density tests.  

## 2024-01-28 NOTE — Progress Notes (Signed)
 Subjective:    Patient ID: [name], female    DOB: March 22, 1951, 73 y.o.   MRN: [id number]  HPI Patient presents for yearly preventative medicine examination. She is a pleasant 73 year old female who  has a past medical history of Arthritis, Cataract, Depression, GERD (gastroesophageal reflux disease), Hyperlipidemia, Hypertension, Osteoporosis, and Thyroid  disease.  Essential Hypertension -well-controlled with lisinopril  20 mg and HCTZ 25 mg daily.  She denies dizziness, lightheadedness, chest pain, or shortness of breath BP Readings from Last 3 Encounters:  01/28/24 120/78  12/06/22 110/62  06/12/22 100/67   Hypothyroidism - takes Synthroid  137 mcg daily.  Lab Results  Component Value Date   TSH 0.93 12/14/2022   Osteoporosis - does Prolia  injections. Last Dexa scan in 04/2021 .The BMD measured at [facility] L[facility] showed a T score of -1.2 which improved from 2019 with a T-score of -2.5  Hyperlipidemia - takes Crestor  20 mg daily. Denies myalgia or fatigue  Lab Results  Component Value Date   CHOL 127 12/14/2022   HDL 58.30 12/14/2022   LDLCALC 47 12/14/2022   LDLDIRECT 114.8 06/23/2009   TRIG 112.0 12/14/2022   CHOLHDL 2 12/14/2022   Chronic Osteoarthritis - mostly in her knees. Cannot walk long distances without significant pain. She has tried aleve and tylenol  with relief but pain is getting worse.   All immunizations and health maintenance protocols were reviewed with the patient and needed orders were placed.  Appropriate screening laboratory values were ordered for the patient including screening of hyperlipidemia, renal function and hepatic function.  Medication reconciliation,  past medical history, social history, problem list and allergies were reviewed in detail with the patient  Goals were established with regard to weight loss, exercise, and  diet in compliance with medications. She eats healthy but does not exercise due to chronic knee pain.   Wt Readings  from Last 3 Encounters:  01/28/24 261 lb (118.4 kg)  08/22/23 263 lb (119.3 kg)  12/06/22 263 lb (119.3 kg)   Review of Systems  Constitutional: Negative.   HENT: Negative.    Eyes: Negative.   Respiratory: Negative.    Cardiovascular: Negative.   Gastrointestinal: Negative.   Endocrine: Negative.   Genitourinary: Negative.   Musculoskeletal:  Positive for arthralgias.  Skin: Negative.   Allergic/Immunologic: Negative.   Neurological: Negative.   Hematological: Negative.   Psychiatric/Behavioral: Negative.     Past Medical History:  Diagnosis Date   Arthritis    bilateral knees   Cataract    not a surgical candidate at this time (05/22/2022)   Depression    GERD (gastroesophageal reflux disease)    Hyperlipidemia    on meds   Hypertension    on meds   Osteoporosis    on meds   Thyroid  disease    on meds    Social History   Socioeconomic History   Marital status: Married    Spouse name: Not on file   Number of children: Not on file   Years of education: Not on file   Highest education level: Bachelor's degree (e.g., BA, AB, BS)  Occupational History   Not on file  Tobacco Use   Smoking status: Never   Smokeless tobacco: Never  Vaping Use   Vaping status: Never Used  Substance and Sexual Activity   Alcohol use: No    Alcohol/week: 0.0 standard drinks of alcohol   Drug use: No   Sexual activity: Not on file  Other Topics Concern  Not on file  Social History Narrative   Not on file   Social Drivers of Health   Financial Resource Strain: Low Risk  (01/28/2024)   Overall Financial Resource Strain (CARDIA)    Difficulty of Paying Living Expenses: Not very hard  Food Insecurity: No Food Insecurity (01/28/2024)   Hunger Vital Sign    Worried About Running Out of Food in the Last Year: Never true    Ran Out of Food in the Last Year: Never true  Transportation Needs: No Transportation Needs (01/28/2024)   PRAPARE - Administrator, Civil Service  (Medical): No    Lack of Transportation (Non-Medical): No  Physical Activity: Unknown (01/28/2024)   Exercise Vital Sign    Days of Exercise per Week: 0 days    Minutes of Exercise per Session: Not on file  Stress: No Stress Concern Present (01/28/2024)   [employer] of Occupational Health - Occupational Stress Questionnaire    Feeling of Stress : Not at all  Social Connections: Socially Integrated (01/28/2024)   Social Connection and Isolation Panel [NHANES]    Frequency of Communication with Friends and Family: Twice a week    Frequency of Social Gatherings with Friends and Family: More than three times a week    Attends Religious Services: More than 4 times per year    Active Member of [employer] or Organizations: Yes    Attends Banker Meetings: More than 4 times per year    Marital Status: Married  Catering manager Violence: Not At Risk (08/22/2023)   Humiliation, Afraid, Rape, and Kick questionnaire    Fear of Current or Ex-Partner: No    Emotionally Abused: No    Physically Abused: No    Sexually Abused: No    Past Surgical History:  Procedure Laterality Date   APPENDECTOMY  1971   COLONOSCOPY  2016   MS-suprep(exc)-tics/TA x2   POLYPECTOMY  2016   TA x 2   UMBILICAL HERNIA REPAIR  2011    Family History  Problem Relation Age of Onset   Hyperlipidemia Mother    Heart disease Mother    Macular degeneration Mother    Dementia Mother    Colon polyps Father 36   Colon cancer Father 82   Colon polyps Maternal Grandfather 95   Colon cancer Maternal Grandfather 39   Breast cancer Paternal Grandmother    Esophageal cancer Neg Hx    Stomach cancer Neg Hx    Rectal cancer Neg Hx     Allergies  Allergen Reactions   Erythromycin     REACTION: Rash   Penicillins     REACTION: Hives   Thyroid  Hormones Hives    Armor Thyroid     Current Outpatient Medications on File Prior to Visit  Medication Sig Dispense Refill   calcium  carbonate (OS-CAL) 600 MG  TABS Take 600 mg by mouth 2 (two) times daily with a meal.     denosumab  (PROLIA ) 60 MG/ML SOLN injection Inject 60 mg into the skin every 6 (six) months. Administer in upper arm, thigh, or abdomen 1 mL 0   hydrochlorothiazide  (HYDRODIURIL ) 25 MG tablet Take 1 tablet (25 mg total) by mouth daily. SCHEDULE APPT FOR FUTURE REFILLS 90 tablet 3   lisinopril  (ZESTRIL ) 20 MG tablet TAKE 1 TABLET DAILY. 90 tablet 3   Multiple Vitamin (MULTIVITAMIN) tablet Take 1 tablet by mouth daily.     Multiple Vitamins-Minerals (OCUVITE ADULT 50+ PO) Take by mouth.     rosuvastatin  (  CRESTOR ) 20 MG tablet Take 1 tablet (20 mg total) by mouth daily. 90 tablet 3   SYNTHROID  137 MCG tablet TAKE 1 TABLET (137 MCG TOTAL) BY MOUTH DAILY BEFORE BREAKFAST. 90 tablet 3   triamcinolone  ointment (KENALOG ) 0.5 % APPLY 1 APPLICATION        TOPICALLY TWO TIMES A DAY 30 g 3   TURMERIC PO Take 800 mg by mouth daily at 6 (six) AM.     No current facility-administered medications on file prior to visit.    BP 120/78   Pulse 74   Temp 97.9 F (36.6 C) (Oral)   Ht 5\' 6"  (1.676 m)   Wt 261 lb (118.4 kg)   SpO2 97%   BMI 42.13 kg/m       Objective:   Physical Exam Vitals and nursing note reviewed.  Constitutional:      General: She is not in acute distress.    Appearance: Normal appearance. She is obese. She is not ill-appearing.  HENT:     Head: Normocephalic and atraumatic.     Right Ear: Tympanic membrane, ear canal and external ear normal. There is no impacted cerumen.     Left Ear: Tympanic membrane, ear canal and external ear normal. There is no impacted cerumen.     Nose: Nose normal. No congestion or rhinorrhea.     Mouth/Throat:     Mouth: Mucous membranes are moist.     Pharynx: Oropharynx is clear.  Eyes:     Extraocular Movements: Extraocular movements intact.     Conjunctiva/sclera: Conjunctivae normal.     Pupils: Pupils are equal, round, and reactive to light.  Neck:     Vascular: No carotid bruit.   Cardiovascular:     Rate and Rhythm: Normal rate and regular rhythm.     Pulses: Normal pulses.     Heart sounds: No murmur heard.    No friction rub. No gallop.  Pulmonary:     Effort: Pulmonary effort is normal.     Breath sounds: Normal breath sounds.  Abdominal:     General: Abdomen is flat. Bowel sounds are normal. There is no distension.     Palpations: Abdomen is soft. There is no mass.     Tenderness: There is no abdominal tenderness. There is no guarding or rebound.     Hernia: No hernia is present.  Musculoskeletal:        General: Normal range of motion.     Cervical back: Normal range of motion and neck supple.  Lymphadenopathy:     Cervical: No cervical adenopathy.  Skin:    General: Skin is warm and dry.     Capillary Refill: Capillary refill takes less than 2 seconds.  Neurological:     General: No focal deficit present.     Mental Status: She is alert and oriented to person, place, and time.  Psychiatric:        Mood and Affect: Mood normal.        Behavior: Behavior normal.        Thought Content: Thought content normal.        Judgment: Judgment normal.       Assessment & Plan:  1. Essential hypertension - Well controlled.  - Continue to eat healthy. Work on weight loss through lifestyle modifications.  - hydrochlorothiazide  (HYDRODIURIL ) 25 MG tablet; Take 1 tablet (25 mg total) by mouth daily. SCHEDULE APPT FOR FUTURE REFILLS  Dispense: 90 tablet; Refill: 3 - lisinopril  (ZESTRIL )  20 MG tablet; TAKE 1 TABLET DAILY.  Dispense: 90 tablet; Refill: 3 - CBC with Differential/Platelet; Future - Comprehensive metabolic panel with GFR; Future - Lipid panel; Future - TSH; Future  2. Hyperlipidemia, unspecified hyperlipidemia type - Continue with Crestor   - rosuvastatin  (CRESTOR ) 20 MG tablet; Take 1 tablet (20 mg total) by mouth daily.  Dispense: 90 tablet; Refill: 3 - CBC with Differential/Platelet; Future - Comprehensive metabolic panel with GFR; Future -  Lipid panel; Future - TSH; Future  3. Hypothyroidism, unspecified type - Continue with synthroid   - SYNTHROID  137 MCG tablet; TAKE 1 TABLET (137 MCG TOTAL) BY MOUTH DAILY BEFORE BREAKFAST.  Dispense: 90 tablet; Refill: 3 - CBC with Differential/Platelet; Future - Comprehensive metabolic panel with GFR; Future - Lipid panel; Future - TSH; Future  4. Age-related osteoporosis without current pathological fracture (Primary)  - DG Bone Density; Future - VITAMIN D  25 Hydroxy (Vit-D Deficiency, Fractures); Future  5. Primary osteoarthritis involving multiple joints  - meloxicam  (MOBIC ) 15 MG tablet; Take 1 tablet (15 mg total) by mouth daily.  Dispense: 90 tablet; Refill: 0  Alto Atta, NP

## 2024-01-28 NOTE — Telephone Encounter (Signed)
 Pt has critical vitamin d  level 111.8. [facility] provider MD Kathlene Paradise notified at [phone].  Reason for Disposition . [1] Follow-up call to recent contact AND [2] information only call, no triage required  Protocols used: Information Only Call - No Triage-A-AH

## 2024-01-29 ENCOUNTER — Other Ambulatory Visit: Payer: Self-pay | Admitting: Adult Health

## 2024-01-29 DIAGNOSIS — E673 Hypervitaminosis D: Secondary | ICD-10-CM

## 2024-02-05 ENCOUNTER — Ambulatory Visit: Admitting: Adult Health

## 2024-05-04 ENCOUNTER — Telehealth: Payer: Self-pay

## 2024-05-04 NOTE — Telephone Encounter (Signed)
 Pt ready for scheduling 06/03/24  Estimated out-of-pocket cost due at time of visit: $0  Primary Insurance:Medicare  Buy/Bill  This summary of benefits is an estimation of the patient's out-of-pocket cost. Exact cost may vary based on individual plan coverage.

## 2024-05-27 ENCOUNTER — Other Ambulatory Visit: Payer: Self-pay | Admitting: Adult Health

## 2024-05-27 DIAGNOSIS — Z1231 Encounter for screening mammogram for malignant neoplasm of breast: Secondary | ICD-10-CM

## 2024-06-01 ENCOUNTER — Telehealth: Admitting: Physician Assistant

## 2024-06-01 DIAGNOSIS — L509 Urticaria, unspecified: Secondary | ICD-10-CM

## 2024-06-01 MED ORDER — PREDNISONE 10 MG (21) PO TBPK: ORAL_TABLET | ORAL | 0 refills | Status: AC

## 2024-06-01 NOTE — Progress Notes (Signed)
 Virtual Visit Consent   [name], you are scheduled for a virtual visit with a [facility] provider today. Just as with appointments in the office, your consent must be obtained to participate. Your consent will be active for this visit and any virtual visit you may have with one of our providers in the next 365 days. If you have a MyChart account, a copy of this consent can be sent to you electronically.  As this is a virtual visit, video technology does not allow for your provider to perform a traditional examination. This may limit your provider's ability to fully assess your condition. If your provider identifies any concerns that need to be evaluated in person or the need to arrange testing (such as labs, EKG, etc.), we will make arrangements to do so. Although advances in technology are sophisticated, we cannot ensure that it will always work on either your end or our end. If the connection with a video visit is poor, the visit may have to be switched to a telephone visit. With either a video or telephone visit, we are not always able to ensure that we have a secure connection.  By engaging in this virtual visit, you consent to the provision of healthcare and authorize for your insurance to be billed (if applicable) for the services provided during this visit. Depending on your insurance coverage, you may receive a charge related to this service.  I need to obtain your verbal consent now. Are you willing to proceed with your visit today? [name] has provided verbal consent on 06/01/2024 for a virtual visit (video or telephone). Delon CHRISTELLA Dickinson, PA-C  Date: 06/01/2024 8:37 AM   Virtual Visit via Video Note   I, Delon CHRISTELLA Dickinson, connected with  [name]  ([id number], October 20, 1950) on 06/01/24 at  8:30 AM EDT by a video-enabled telemedicine application and verified that I am speaking with the correct person using two identifiers.  Location: Patient: Virtual Visit Location  Patient: Home Provider: Virtual Visit Location Provider: Home Office   I discussed the limitations of evaluation and management by telemedicine and the availability of in person appointments. The patient expressed understanding and agreed to proceed.    History of Present Illness: [name] is a 73 y.o. who identifies as a female who was assigned female at birth, and is being seen today for rash.  HPI: Rash This is a new problem. The current episode started in the past 7 days (Saturday, 05/30/24). The problem has been gradually worsening since onset. The affected locations include the head, face, neck and chest. The rash is characterized by redness and itchiness. It is unknown if there was an exposure to a precipitant. Pertinent negatives include no facial edema, rhinorrhea or shortness of breath. Past treatments include nothing. The treatment provided no relief.    Problems:  Patient Active Problem List   Diagnosis Date Noted   Calcific bursitis 07/20/2016   Degenerative arthritis of right knee 07/20/2016   Rosacea, acne 08/12/2013   MORBID OBESITY 07/03/2010   Osteoporosis 02/22/2009   Hypothyroidism 05/13/2007   Hyperlipidemia 04/29/2007   Essential hypertension 04/29/2007    Allergies:  Allergies  Allergen Reactions   Erythromycin     REACTION: Rash   Penicillins     REACTION: Hives   Thyroid  Hormones Hives    Armor Thyroid    Medications:  Current Outpatient Medications:    predniSONE  (STERAPRED UNI-PAK 21 TAB) 10 MG (21) TBPK tablet, 6 day taper;  take as directed on package instructions, Disp: 21 tablet, Rfl: 0   calcium  carbonate (OS-CAL) 600 MG TABS, Take 600 mg by mouth 2 (two) times daily with a meal., Disp: , Rfl:    denosumab  (PROLIA ) 60 MG/ML SOLN injection, Inject 60 mg into the skin every 6 (six) months. Administer in upper arm, thigh, or abdomen, Disp: 1 mL, Rfl: 0   hydrochlorothiazide  (HYDRODIURIL ) 25 MG tablet, Take 1 tablet (25 mg total) by mouth daily.  SCHEDULE APPT FOR FUTURE REFILLS, Disp: 90 tablet, Rfl: 3   lisinopril  (ZESTRIL ) 20 MG tablet, TAKE 1 TABLET DAILY., Disp: 90 tablet, Rfl: 3   meloxicam  (MOBIC ) 15 MG tablet, Take 1 tablet (15 mg total) by mouth daily., Disp: 90 tablet, Rfl: 0   Multiple Vitamin (MULTIVITAMIN) tablet, Take 1 tablet by mouth daily., Disp: , Rfl:    Multiple Vitamins-Minerals (OCUVITE ADULT 50+ PO), Take by mouth., Disp: , Rfl:    rosuvastatin  (CRESTOR ) 20 MG tablet, Take 1 tablet (20 mg total) by mouth daily., Disp: 90 tablet, Rfl: 3   SYNTHROID  137 MCG tablet, TAKE 1 TABLET (137 MCG TOTAL) BY MOUTH DAILY BEFORE BREAKFAST., Disp: 90 tablet, Rfl: 3   triamcinolone  ointment (KENALOG ) 0.5 %, APPLY 1 APPLICATION        TOPICALLY TWO TIMES A DAY, Disp: 30 g, Rfl: 3   TURMERIC PO, Take 800 mg by mouth daily at 6 (six) AM., Disp: , Rfl:   Observations/Objective: Patient is well-developed, well-nourished in no acute distress.  Resting comfortably at home.  Head is normocephalic, atraumatic.  No labored breathing.  Speech is clear and coherent with logical content.  Patient is alert and oriented at baseline.  Hard to appreciate on video, but there did appear to be a red patch on the forehead just right of midline, also reported hives on neck and chest, itching of both ears  Assessment and Plan: 1. Hives (Primary) - predniSONE  (STERAPRED UNI-PAK 21 TAB) 10 MG (21) TBPK tablet; 6 day taper; take as directed on package instructions  Dispense: 21 tablet; Refill: 0  - Suspected allergic reaction, possibly to a bug bite - Prednisone  prescribed - Discussed could add a 24-hr non-drowsy antihistamine (Claritin, Zyrtec, Allegra) if needed - Discussed adding Pepcid (famotidine) 20mg  once or twice daily if needed - Cool to luke-warm showers - Cold compresses - Moisturize skin well - Seek in person evaluation if not improving or if worsens  Follow Up Instructions: I discussed the assessment and treatment plan with the  patient. The patient was provided an opportunity to ask questions and all were answered. The patient agreed with the plan and demonstrated an understanding of the instructions.  A copy of instructions were sent to the patient via MyChart unless otherwise noted below.    The patient was advised to call back or seek an in-person evaluation if the symptoms worsen or if the condition fails to improve as anticipated.    Delon CHRISTELLA Dickinson, PA-C

## 2024-06-01 NOTE — Patient Instructions (Signed)
 Devere LITTIE English, thank you for joining Delon CHRISTELLA Dickinson, PA-C for today's virtual visit.  While this provider is not your primary care provider (PCP), if your PCP is located in our provider database this encounter information will be shared with them immediately following your visit.   A [facility] MyChart account gives you access to today's visit and all your visits, tests, and labs performed at [facility]  click here if you don't have a [facility] MyChart account or go to [email or web address]  Consent: (Patient) [name] provided verbal consent for this virtual visit at the beginning of the encounter.  Current Medications:  Current Outpatient Medications:    predniSONE  (STERAPRED UNI-PAK 21 TAB) 10 MG (21) TBPK tablet, 6 day taper; take as directed on package instructions, Disp: 21 tablet, Rfl: 0   calcium  carbonate (OS-CAL) 600 MG TABS, Take 600 mg by mouth 2 (two) times daily with a meal., Disp: , Rfl:    denosumab  (PROLIA ) 60 MG/ML SOLN injection, Inject 60 mg into the skin every 6 (six) months. Administer in upper arm, thigh, or abdomen, Disp: 1 mL, Rfl: 0   hydrochlorothiazide  (HYDRODIURIL ) 25 MG tablet, Take 1 tablet (25 mg total) by mouth daily. SCHEDULE APPT FOR FUTURE REFILLS, Disp: 90 tablet, Rfl: 3   lisinopril  (ZESTRIL ) 20 MG tablet, TAKE 1 TABLET DAILY., Disp: 90 tablet, Rfl: 3   meloxicam  (MOBIC ) 15 MG tablet, Take 1 tablet (15 mg total) by mouth daily., Disp: 90 tablet, Rfl: 0   Multiple Vitamin (MULTIVITAMIN) tablet, Take 1 tablet by mouth daily., Disp: , Rfl:    Multiple Vitamins-Minerals (OCUVITE ADULT 50+ PO), Take by mouth., Disp: , Rfl:    rosuvastatin  (CRESTOR ) 20 MG tablet, Take 1 tablet (20 mg total) by mouth daily., Disp: 90 tablet, Rfl: 3   SYNTHROID  137 MCG tablet, TAKE 1 TABLET (137 MCG TOTAL) BY MOUTH DAILY BEFORE BREAKFAST., Disp: 90 tablet, Rfl: 3   triamcinolone  ointment (KENALOG ) 0.5 %, APPLY 1 APPLICATION        TOPICALLY TWO  TIMES A DAY, Disp: 30 g, Rfl: 3   TURMERIC PO, Take 800 mg by mouth daily at 6 (six) AM., Disp: , Rfl:    Medications ordered in this encounter:  Meds ordered this encounter  Medications   predniSONE  (STERAPRED UNI-PAK 21 TAB) 10 MG (21) TBPK tablet    Sig: 6 day taper; take as directed on package instructions    Dispense:  21 tablet    Refill:  0    Supervising Provider:   BLAISE ALEENE KIDD [[id number]]     *If you need refills on other medications prior to your next appointment, please contact your pharmacy*  Follow-Up: Call back or seek an in-person evaluation if the symptoms worsen or if the condition fails to improve as anticipated.  [facility] [phone]  Other Instructions Rash, Adult A rash is a breakout of spots or blotches on the skin. It can affect the way the skin looks and feels. Many things can cause a rash. Common causes include: Viral infections. These include colds, measles, and hand, foot, and mouth disease. Bacterial infections. These include scarlet fever and impetigo. Fungal infections. These include athlete's foot, ringworm, and yeast rashes. Skin irritation. This may be from heat rash, exposure to moisture or friction for a long time (intertrigo), or exposure to soap or skin care products (eczema). Allergic reactions. These may be caused by foods, medicines, or things like poison ivy. Some rashes may go  away after a few days. Others may last for a few weeks. The goal of treatment is to stop the itching and keep the rash from spreading. Follow these instructions at home: Medicine Take or apply over-the-counter and prescription medicines only as told by your health care provider. These may include: Corticosteroids. These can help treat red or swollen skin. They may be given as creams or as medicines to take by mouth (orally). Anti-itch lotions. Allergy medicines. Pain medicine. Antifungal medicine if the rash is from a fungal  infection. Antibiotics if you have an infection.  Skin care Apply cool, wet cloths (compresses) to the affected areas. Do not scratch or rub your skin. Avoid covering the rash. Keep it exposed to air as often as you can. Managing itching and discomfort Avoid hot showers and baths. These can make itching worse. A cold shower may help. Try taking a bath with: Epsom salts. You can get these at your local pharmacy or grocery store. Follow the instructions on the package. Baking soda. Pour a small amount into the bath as told by your provider. Colloidal oatmeal. You can get this at your local pharmacy or grocery store. Follow the instructions on the package. Try putting baking soda paste on your skin. Stir water into baking soda until it becomes like a paste. Try using calamine lotion or cortisone cream to help with itchiness. Keep cool. Stay out of the sun. Sweating and being hot can make itching worse. General instructions  Rest as needed. Drink enough fluid to keep your pee (urine) pale yellow. Wear loose-fitting clothes. Avoid scented soaps, detergents, and perfumes. Use gentle soaps, detergents, perfumes, and cosmetics. Avoid the things that cause your rash (triggers). Keep a journal to help keep track of your triggers. Write down: What you eat. What cosmetics you use. What you drink. What you wear. This includes jewelry. Contact a health care provider if: You sweat at night more than normal. You pee (urinate) more or less than normal, or your pee is a darker color than normal. Your eyes become sensitive to light. Your skin or the white parts of your eyes turn yellow (jaundice). Your skin tingles or is numb. You get painful blisters in your nose or mouth. Your rash does not go away after a few days, or it gets worse. You are more tired or thirsty than normal. You have new or worse symptoms. These may include: Pain in your abdomen. Fever. Diarrhea or vomiting. Weakness or  weight loss. Get help right away if: You get confused. You have a severe headache, a stiff neck, or severe joint pain or stiffness. You become very sleepy or not responsive. You have a seizure. This information is not intended to replace advice given to you by your health care provider. Make sure you discuss any questions you have with your health care provider. Document Revised: 07/13/2022 Document Reviewed: 07/13/2022 Elsevier Patient Education  2024 Elsevier Inc.   If you have been instructed to have an in-person evaluation today at a local Urgent Care facility, please use the link below. It will take you to a list of all of our available [facility], including address, phone number and hours of operation. Please do not delay care.  [facility]  If you or a family member do not have a primary care provider, use the link below to schedule a visit and establish care. When you choose a [facility] primary care physician or advanced practice provider, you gain a  long-term partner in health. Find a Primary Care Provider  Learn more about [facility] in-office and virtual care options: [facility] - Get Care Now

## 2024-06-12 ENCOUNTER — Encounter

## 2024-06-12 DIAGNOSIS — Z1231 Encounter for screening mammogram for malignant neoplasm of breast: Secondary | ICD-10-CM

## 2024-06-18 ENCOUNTER — Ambulatory Visit
Admission: RE | Admit: 2024-06-18 | Discharge: 2024-06-18 | Disposition: A | Discharge status: Home / Self Care | Source: Ambulatory Visit | Attending: Adult Health | Admitting: Adult Health

## 2024-06-18 DIAGNOSIS — Z1231 Encounter for screening mammogram for malignant neoplasm of breast: Secondary | ICD-10-CM

## 2024-06-19 ENCOUNTER — Other Ambulatory Visit: Payer: Self-pay

## 2024-06-19 DIAGNOSIS — E039 Hypothyroidism, unspecified: Secondary | ICD-10-CM

## 2024-06-19 DIAGNOSIS — M810 Age-related osteoporosis without current pathological fracture: Secondary | ICD-10-CM

## 2024-06-19 MED ORDER — DENOSUMAB 60 MG/ML ~~LOC~~ SOSY
60.0000 mg | PREFILLED_SYRINGE | SUBCUTANEOUS | Status: AC
  Administered 2024-06-26: 60 mg via SUBCUTANEOUS

## 2024-06-19 MED ORDER — SYNTHROID 137 MCG PO TABS: ORAL_TABLET | ORAL | 3 refills | Status: AC

## 2024-06-19 NOTE — Progress Notes (Signed)
 Patient is on Bone Density Report showing overdue for Prolia  injection. Prolia  has been ordered to trigger referral. Will wait for pharmacy team to identify if patient can receive Prolia  or one of the bio similars.

## 2024-06-22 ENCOUNTER — Telehealth: Payer: Self-pay

## 2024-06-22 NOTE — Telephone Encounter (Signed)
 SABRA

## 2024-06-22 NOTE — Telephone Encounter (Signed)
 Prolia VOB initiated via [email or web address]  Next Prolia inj DUE: NOW

## 2024-06-22 NOTE — Telephone Encounter (Signed)
 Pt ready for scheduling for PROLIA  on or after : 06/22/24  Option# 1: Buy/Bill (Office supplied medication)  Out-of-pocket cost due at time of clinic visit: $0  Number of injection/visits approved: ---  Primary: MEDICARE Prolia  co-insurance: 0% Admin fee co-insurance: 0%  Secondary: AETNA-MEDSUP Prolia  co-insurance:  Admin fee co-insurance:   Medical Benefit Details: Date Benefits were checked: 06/22/24 Deductible: $257 Met of $257 Required/ Coinsurance: 0%/ Admin Fee: 0%  Prior Auth: N/A PA# Expiration Date:   # of doses approved: ----------------------------------------------------------------------- Option# 2- Med Obtained from pharmacy:  Pharmacy benefit: Copay $--- (Paid to pharmacy) Admin Fee: --- (Pay at clinic)  Prior Auth: --- PA# Expiration Date:   # of doses approved:   If patient wants fill through the pharmacy benefit please send prescription to: ---, and include estimated need by date in rx notes. Pharmacy will ship medication directly to the office.  Patient NOT eligible for Prolia  Copay Card. Copay Card can make patient's cost as little as $25. Link to apply: [email or web address]://[email or web address].[email or web address]  ** This summary of benefits is an estimation of the patient's out-of-pocket cost. Exact cost may very based on individual plan coverage.

## 2024-06-26 ENCOUNTER — Ambulatory Visit (INDEPENDENT_AMBULATORY_CARE_PROVIDER_SITE_OTHER)

## 2024-06-26 DIAGNOSIS — M810 Age-related osteoporosis without current pathological fracture: Secondary | ICD-10-CM | POA: Diagnosis not present

## 2024-06-26 NOTE — Patient Instructions (Signed)
 Health Maintenance Due  Topic Date Due   Zoster Vaccines- Shingrix (1 of 2) Never done   Influenza Vaccine  05/08/2024   COVID-19 Vaccine (3 - 2025-26 season) 06/08/2024   Medicare Annual Wellness (AWV)  08/21/2024       08/22/2023   12:20 PM 12/06/2022    1:58 PM 08/13/2022   11:46 AM  Depression screen PHQ 2/9  Decreased Interest 0 0 0  Down, Depressed, Hopeless 0 0 0  PHQ - 2 Score 0 0 0  Altered sleeping 0 1   Tired, decreased energy 0 3   Change in appetite 0 1   Feeling bad or failure about yourself  0 0   Trouble concentrating 0 0   Moving slowly or fidgety/restless 0 0   Suicidal thoughts 0 0   PHQ-9 Score 0 5   Difficult doing work/chores  Not difficult at all

## 2024-06-26 NOTE — Progress Notes (Signed)
 Per orders of Darleene Shape, NP, injection of Prolia  given in Right arm by Marjorie JONELLE Louder. Patient tolerated injection well.

## 2024-09-10 ENCOUNTER — Ambulatory Visit: Admitting: Family Medicine

## 2024-09-16 ENCOUNTER — Telehealth: Admitting: Physician Assistant

## 2024-09-16 DIAGNOSIS — J028 Acute pharyngitis due to other specified organisms: Secondary | ICD-10-CM | POA: Diagnosis not present

## 2024-09-16 DIAGNOSIS — B9689 Other specified bacterial agents as the cause of diseases classified elsewhere: Secondary | ICD-10-CM | POA: Diagnosis not present

## 2024-09-16 MED ORDER — AZITHROMYCIN 250 MG PO TABS: ORAL_TABLET | ORAL | 0 refills | Status: AC

## 2024-09-16 MED ORDER — LIDOCAINE VISCOUS HCL 2 % MT SOLN: 5.0000 mL | Freq: Four times a day (QID) | OROMUCOSAL | 0 refills | Status: AC | PRN

## 2024-09-16 NOTE — Progress Notes (Signed)
 Virtual Visit Consent   [name], you are scheduled for a virtual visit with a [facility] provider today. Just as with appointments in the office, your consent must be obtained to participate. Your consent will be active for this visit and any virtual visit you may have with one of our providers in the next 365 days. If you have a MyChart account, a copy of this consent can be sent to you electronically.  As this is a virtual visit, video technology does not allow for your provider to perform a traditional examination. This may limit your provider's ability to fully assess your condition. If your provider identifies any concerns that need to be evaluated in person or the need to arrange testing (such as labs, EKG, etc.), we will make arrangements to do so. Although advances in technology are sophisticated, we cannot ensure that it will always work on either your end or our end. If the connection with a video visit is poor, the visit may have to be switched to a telephone visit. With either a video or telephone visit, we are not always able to ensure that we have a secure connection.  By engaging in this virtual visit, you consent to the provision of healthcare and authorize for your insurance to be billed (if applicable) for the services provided during this visit. Depending on your insurance coverage, you may receive a charge related to this service.  I need to obtain your verbal consent now. Are you willing to proceed with your visit today? ADALIZ DOBIS has provided verbal consent on 09/16/2024 for a virtual visit (video or telephone). Martha CHRISTELLA Dickinson, PA-C  Date: 09/16/2024 10:18 AM   Virtual Visit via Video Note   I, [name], connected with  [name]  ([id number], [birth year]) on 09/16/24 at 10:00 AM EST by a video-enabled telemedicine application and verified that I am speaking with the correct person using two identifiers.  Location: Patient: Virtual Visit  Location Patient: Home Provider: Virtual Visit Location Provider: Home Office   I discussed the limitations of evaluation and management by telemedicine and the availability of in person appointments. The patient expressed understanding and agreed to proceed.    History of Present Illness: [name] is a 73 y.o. who identifies as a female who was assigned female at birth, and is being seen today for sore throat.  HPI: Sore Throat  This is a new problem. The current episode started yesterday. The problem has been gradually worsening. Maximum temperature: subjective fever, chilled. Associated symptoms include coughing, ear pain (right), headaches, a hoarse voice, swollen glands and trouble swallowing. Pertinent negatives include no congestion, diarrhea, ear discharge, plugged ear sensation, shortness of breath or vomiting. She has tried nothing for the symptoms. The treatment provided no relief.    Problems:  Patient Active Problem List   Diagnosis Date Noted   Calcific bursitis 07/20/2016   Degenerative arthritis of right knee 07/20/2016   Rosacea, acne 08/12/2013   MORBID OBESITY 07/03/2010   Osteoporosis 02/22/2009   Hypothyroidism 05/13/2007   Hyperlipidemia 04/29/2007   Essential hypertension 04/29/2007    Allergies:  Allergies  Allergen Reactions   Erythromycin     REACTION: Rash   Penicillins     REACTION: Hives   Thyroid  Hormones Hives    Armor Thyroid    Medications:  Current Outpatient Medications:    azithromycin (ZITHROMAX) 250 MG tablet, Take 2 tablets on day 1, then 1 tablet daily on days 2 through  5, Disp: 6 tablet, Rfl: 0   lidocaine (XYLOCAINE) 2 % solution, Use as directed 5-10 mLs in the mouth or throat every 6 (six) hours as needed (sore throat)., Disp: 100 mL, Rfl: 0   calcium  carbonate (OS-CAL) 600 MG TABS, Take 600 mg by mouth 2 (two) times daily with a meal., Disp: , Rfl:    denosumab  (PROLIA ) 60 MG/ML SOLN injection, Inject 60 mg into the skin every 6  (six) months. Administer in upper arm, thigh, or abdomen, Disp: 1 mL, Rfl: 0   hydrochlorothiazide  (HYDRODIURIL ) 25 MG tablet, Take 1 tablet (25 mg total) by mouth daily. SCHEDULE APPT FOR FUTURE REFILLS, Disp: 90 tablet, Rfl: 3   lisinopril  (ZESTRIL ) 20 MG tablet, TAKE 1 TABLET DAILY., Disp: 90 tablet, Rfl: 3   meloxicam  (MOBIC ) 15 MG tablet, Take 1 tablet (15 mg total) by mouth daily., Disp: 90 tablet, Rfl: 0   Multiple Vitamin (MULTIVITAMIN) tablet, Take 1 tablet by mouth daily., Disp: , Rfl:    Multiple Vitamins-Minerals (OCUVITE ADULT 50+ PO), Take by mouth., Disp: , Rfl:    predniSONE  (STERAPRED UNI-PAK 21 TAB) 10 MG (21) TBPK tablet, 6 day taper; take as directed on package instructions, Disp: 21 tablet, Rfl: 0   rosuvastatin  (CRESTOR ) 20 MG tablet, Take 1 tablet (20 mg total) by mouth daily., Disp: 90 tablet, Rfl: 3   SYNTHROID  137 MCG tablet, TAKE 1 TABLET (137 MCG TOTAL) BY MOUTH DAILY BEFORE BREAKFAST., Disp: 90 tablet, Rfl: 3   triamcinolone  ointment (KENALOG ) 0.5 %, APPLY 1 APPLICATION        TOPICALLY TWO TIMES A DAY, Disp: 30 g, Rfl: 3   TURMERIC PO, Take 800 mg by mouth daily at 6 (six) AM., Disp: , Rfl:   Observations/Objective: Patient is well-developed, well-nourished in no acute distress.  Resting comfortably at home.  Head is normocephalic, atraumatic.  No labored breathing.  Speech is clear and coherent with logical content.  Patient is alert and oriented at baseline.    Assessment and Plan: 1. Acute bacterial pharyngitis (Primary) - lidocaine (XYLOCAINE) 2 % solution; Use as directed 5-10 mLs in the mouth or throat every 6 (six) hours as needed (sore throat).  Dispense: 100 mL; Refill: 0 - azithromycin (ZITHROMAX) 250 MG tablet; Take 2 tablets on day 1, then 1 tablet daily on days 2 through 5  Dispense: 6 tablet; Refill: 0  - Suspect strep throat - Azithromycin (previously tolerated per patient) and Viscous lidocaine prescribed - Tylenol  and Ibuprofen alternating  every 4 hours - Salt water gargles - Chloraseptic spray - Liquid and soft food diet - Push fluids - New toothbrush in 3 days - Seek in person evaluation if not improving or if symptoms worsen   Follow Up Instructions: I discussed the assessment and treatment plan with the patient. The patient was provided an opportunity to ask questions and all were answered. The patient agreed with the plan and demonstrated an understanding of the instructions.  A copy of instructions were sent to the patient via MyChart unless otherwise noted below.    The patient was advised to call back or seek an in-person evaluation if the symptoms worsen or if the condition fails to improve as anticipated.    Martha CHRISTELLA Dickinson, PA-C

## 2024-09-16 NOTE — Patient Instructions (Signed)
 [name], thank you for joining Martha CHRISTELLA Dickinson, PA-C for today's virtual visit.  While this provider is not your primary care provider (PCP), if your PCP is located in our provider database this encounter information will be shared with them immediately following your visit.   A [facility] MyChart account gives you access to today's visit and all your visits, tests, and labs performed at [facility]  click here if you don't have a [facility] MyChart account or go to [email or web address]  Consent: (Patient) [name] provided verbal consent for this virtual visit at the beginning of the encounter.  Current Medications:  Current Outpatient Medications:    azithromycin (ZITHROMAX) 250 MG tablet, Take 2 tablets on day 1, then 1 tablet daily on days 2 through 5, Disp: 6 tablet, Rfl: 0   lidocaine (XYLOCAINE) 2 % solution, Use as directed 5-10 mLs in the mouth or throat every 6 (six) hours as needed (sore throat)., Disp: 100 mL, Rfl: 0   calcium  carbonate (OS-CAL) 600 MG TABS, Take 600 mg by mouth 2 (two) times daily with a meal., Disp: , Rfl:    denosumab  (PROLIA ) 60 MG/ML SOLN injection, Inject 60 mg into the skin every 6 (six) months. Administer in upper arm, thigh, or abdomen, Disp: 1 mL, Rfl: 0   hydrochlorothiazide  (HYDRODIURIL ) 25 MG tablet, Take 1 tablet (25 mg total) by mouth daily. SCHEDULE APPT FOR FUTURE REFILLS, Disp: 90 tablet, Rfl: 3   lisinopril  (ZESTRIL ) 20 MG tablet, TAKE 1 TABLET DAILY., Disp: 90 tablet, Rfl: 3   meloxicam  (MOBIC ) 15 MG tablet, Take 1 tablet (15 mg total) by mouth daily., Disp: 90 tablet, Rfl: 0   Multiple Vitamin (MULTIVITAMIN) tablet, Take 1 tablet by mouth daily., Disp: , Rfl:    Multiple Vitamins-Minerals (OCUVITE ADULT 50+ PO), Take by mouth., Disp: , Rfl:    predniSONE  (STERAPRED UNI-PAK 21 TAB) 10 MG (21) TBPK tablet, 6 day taper; take as directed on package instructions, Disp: 21 tablet, Rfl: 0   rosuvastatin  (CRESTOR ) 20  MG tablet, Take 1 tablet (20 mg total) by mouth daily., Disp: 90 tablet, Rfl: 3   SYNTHROID  137 MCG tablet, TAKE 1 TABLET (137 MCG TOTAL) BY MOUTH DAILY BEFORE BREAKFAST., Disp: 90 tablet, Rfl: 3   triamcinolone  ointment (KENALOG ) 0.5 %, APPLY 1 APPLICATION        TOPICALLY TWO TIMES A DAY, Disp: 30 g, Rfl: 3   TURMERIC PO, Take 800 mg by mouth daily at 6 (six) AM., Disp: , Rfl:    Medications ordered in this encounter:  Meds ordered this encounter  Medications   lidocaine (XYLOCAINE) 2 % solution    Sig: Use as directed 5-10 mLs in the mouth or throat every 6 (six) hours as needed (sore throat).    Dispense:  100 mL    Refill:  0    Supervising Provider:   BLAISE ALEENE KIDD [[id number]]   azithromycin (ZITHROMAX) 250 MG tablet    Sig: Take 2 tablets on day 1, then 1 tablet daily on days 2 through 5    Dispense:  6 tablet    Refill:  0    Supervising Provider:   LAMPTEY, PHILIP O [phone]     *If you need refills on other medications prior to your next appointment, please contact your pharmacy*  Follow-Up: Call back or seek an in-person evaluation if the symptoms worsen or if the condition fails to improve as anticipated.  [facility] [phone]  Other Instructions Pharyngitis  Pharyngitis is inflammation of the throat (pharynx). It is a very common cause of sore throat. Pharyngitis can be caused by a bacteria, but it is usually caused by a virus. Most cases of pharyngitis get better on their own without treatment. What are the causes? This condition may be caused by: Infection by viruses (viral). Viral pharyngitis spreads easily from person to person (is contagious) through coughing, sneezing, and sharing of personal items or utensils such as cups, forks, spoons, and toothbrushes. Infection by bacteria (bacterial). Bacterial pharyngitis may be spread by touching the nose or face after coming in contact with the bacteria, or through close contact, such as  kissing. Allergies. Allergies can cause buildup of mucus in the throat (post-nasal drip), leading to inflammation and irritation. Allergies can also cause blocked nasal passages, forcing breathing through the mouth, which dries and irritates the throat. What increases the risk? You are more likely to develop this condition if: You are 22-33 years old. You are exposed to crowded environments such as daycare, school, or dormitory living. You live in a cold climate. You have a weakened disease-fighting (immune) system. What are the signs or symptoms? Symptoms of this condition vary by the cause. Common symptoms of this condition include: Sore throat. Fatigue. Low-grade fever. Stuffy nose (nasal congestion) and cough. Headache. Other symptoms may include: Glands in the neck (lymph nodes) that are swollen. Skin rashes. Plaque-like film on the throat or tonsils. This is often a symptom of bacterial pharyngitis. Vomiting. Red, itchy eyes (conjunctivitis). Loss of appetite. Joint pain and muscle aches. Enlarged tonsils. How is this diagnosed? This condition may be diagnosed based on your medical history and a physical exam. Your health care provider will ask you questions about your illness and your symptoms. A swab of your throat may be done to check for bacteria (rapid strep test). Other lab tests may also be done, depending on the suspected cause, but these are rare. How is this treated? Many times, treatment is not needed for this condition. Pharyngitis usually gets better in 3-4 days without treatment. Bacterial pharyngitis may be treated with antibiotic medicines. Follow these instructions at home: Medicines Take over-the-counter and prescription medicines only as told by your health care provider. If you were prescribed an antibiotic medicine, take it as told by your health care provider. Do not stop taking the antibiotic even if you start to feel better. Use throat sprays to soothe  your throat as told by your health care provider. Children can get pharyngitis. Do not give your child aspirin because of the association with Reye's syndrome. Managing pain To help with pain, try: Sipping warm liquids, such as broth, herbal tea, or warm water. Eating or drinking cold or frozen liquids, such as frozen ice pops. Gargling with a mixture of salt and water 3-4 times a day or as needed. To make salt water, completely dissolve -1 tsp (3-6 g) of salt in 1 cup (237 mL) of warm water. Sucking on hard candy or throat lozenges. Putting a cool-mist humidifier in your bedroom at night to moisten the air. Sitting in the bathroom with the door closed for 5-10 minutes while you run hot water in the shower.  General instructions  Do not use any products that contain nicotine or tobacco. These products include cigarettes, chewing tobacco, and vaping devices, such as e-cigarettes. If you need help quitting, ask your health care provider. Rest as told by your health care provider. Drink enough fluid to keep your  urine pale yellow. How is this prevented? To help prevent becoming infected or spreading infection: Wash your hands often with soap and water for at least 20 seconds. If soap and water are not available, use hand sanitizer. Do not touch your eyes, nose, or mouth with unwashed hands, and wash hands after touching these areas. Do not share cups or eating utensils. Avoid close contact with people who are sick. Contact a health care provider if: You have large, tender lumps in your neck. You have a rash. You cough up green, yellow-brown, or bloody mucus. Get help right away if: Your neck becomes stiff. You drool or are unable to swallow liquids. You cannot drink or take medicines without vomiting. You have severe pain that does not go away, even after you take medicine. You have trouble breathing, and it is not caused by a stuffy nose. You have new pain and swelling in your joints  such as the knees, ankles, wrists, or elbows. These symptoms may represent a serious problem that is an emergency. Do not wait to see if the symptoms will go away. Get medical help right away. Call your local emergency services (911 in the [place] Do not drive yourself to the hospital. Summary Pharyngitis is redness, pain, and swelling (inflammation) of the throat (pharynx). While pharyngitis can be caused by a bacteria, the most common causes are viral. Most cases of pharyngitis get better on their own without treatment. Bacterial pharyngitis is treated with antibiotic medicines. This information is not intended to replace advice given to you by your health care provider. Make sure you discuss any questions you have with your health care provider. Document Revised: 12/21/2020 Document Reviewed: 12/21/2020 Elsevier Patient Education  2024 [employer]   If you have been instructed to have an in-person evaluation today at a local Urgent Care facility, please use the link below. It will take you to a list of all of our available [facility], including address, phone number and hours of operation. Please do not delay care.  [facility]  If you or a family member do not have a primary care provider, use the link below to schedule a visit and establish care. When you choose a [facility] primary care physician or advanced practice provider, you gain a long-term partner in health. Find a Primary Care Provider  Learn more about [facility] in-office and virtual care options: [facility] - Get Care Now

## 2024-12-11 ENCOUNTER — Ambulatory Visit
